# Patient Record
Sex: Female | Born: 1995 | Race: White | Hispanic: No | Marital: Single | Smoking: Never smoker
Health system: Southern US, Community
[De-identification: ages and names within clinical notes are randomized; demographics above are authoritative.]

## PROBLEM LIST (undated history)

## (undated) DIAGNOSIS — N189 Chronic kidney disease, unspecified: Secondary | ICD-10-CM

## (undated) HISTORY — PX: NO PAST SURGERIES: SHX2092

---

## 2020-07-31 ENCOUNTER — Emergency Department (HOSPITAL_COMMUNITY)
Admission: EM | Admit: 2020-07-31 | Discharge: 2020-07-31 | Disposition: A | Discharge status: Home / Self Care | Payer: 59 | Attending: Emergency Medicine | Admitting: Emergency Medicine

## 2020-07-31 ENCOUNTER — Encounter (HOSPITAL_COMMUNITY): Payer: Self-pay | Admitting: Emergency Medicine

## 2020-07-31 ENCOUNTER — Other Ambulatory Visit: Payer: Self-pay

## 2020-07-31 ENCOUNTER — Emergency Department (HOSPITAL_COMMUNITY): Payer: 59

## 2020-07-31 DIAGNOSIS — S93602A Unspecified sprain of left foot, initial encounter: Secondary | ICD-10-CM | POA: Diagnosis not present

## 2020-07-31 DIAGNOSIS — X501XXA Overexertion from prolonged static or awkward postures, initial encounter: Secondary | ICD-10-CM | POA: Diagnosis not present

## 2020-07-31 DIAGNOSIS — S99922A Unspecified injury of left foot, initial encounter: Secondary | ICD-10-CM | POA: Diagnosis present

## 2020-07-31 DIAGNOSIS — T1490XA Injury, unspecified, initial encounter: Secondary | ICD-10-CM

## 2020-07-31 NOTE — ED Provider Notes (Signed)
[facility]-EMERGENCY DEPT Provider Note   CSN: [id number] Arrival date & time: 07/31/20  4332     History Chief Complaint  Patient presents with  . Foot Pain    Mary Hutchinson is a 24 y.o. female.  24 year old female presents with left lateral foot pain.  Patient states that she stood up and did not realize her foot was asleep and rolled her foot.  Reports pain to the lateral aspect of her foot, worse with movement or bearing weight.  No other injuries, complaints, concerns.        History reviewed. No pertinent past medical history.  There are no problems to display for this patient.   History reviewed. No pertinent surgical history.   OB History   No obstetric history on file.     No family history on file.  Social History   Tobacco Use  . Smoking status: Not on file  Substance Use Topics  . Alcohol use: Not on file  . Drug use: Not on file    Home Medications Prior to Admission medications   Not on File    Allergies    Patient has no known allergies.  Review of Systems   Review of Systems  Constitutional: Negative for fever.  Musculoskeletal: Positive for arthralgias, gait problem and myalgias.  Skin: Negative for color change, rash and wound.  Allergic/Immunologic: Negative for immunocompromised state.  Neurological: Negative for weakness and numbness.    Physical Exam Updated Vital Signs BP 134/82 (BP Location: Right Arm)   Pulse (!) 106   Temp 98.4 F (36.9 C) (Oral) Comment: 98.4  Resp 16   SpO2 100%   Physical Exam Vitals and nursing note reviewed.  Constitutional:      General: She is not in acute distress.    Appearance: She is well-developed. She is not diaphoretic.  HENT:     Head: Normocephalic and atraumatic.  Cardiovascular:     Pulses: Normal pulses.  Pulmonary:     Effort: Pulmonary effort is normal.  Musculoskeletal:        General: Tenderness present. No swelling or deformity.     Left ankle:  Normal. No swelling, deformity or ecchymosis. No tenderness. Normal range of motion. Normal pulse.     Left foot: Normal capillary refill. Tenderness present. No bony tenderness or crepitus. Normal pulse.       Feet:  Skin:    General: Skin is warm and dry.     Findings: No erythema or rash.  Neurological:     Mental Status: She is alert and oriented to person, place, and time.  Psychiatric:        Behavior: Behavior normal.     ED Results / Procedures / Treatments   Labs (all labs ordered are listed, but only abnormal results are displayed) Labs Reviewed - No data to display  EKG None  Radiology DG Foot Complete Left  Result Date: 07/31/2020 CLINICAL DATA:  Left foot pain after fall. EXAM: LEFT FOOT - COMPLETE 3+ VIEW COMPARISON:  No prior. FINDINGS: No acute bony or joint abnormality. No evidence of fracture dislocation. IMPRESSION: Acute abnormality. Electronically Signed   By: Maisie Fus  Register   On: 07/31/2020 09:51    Procedures Procedures (including critical care time)  Medications Ordered in ED Medications - No data to display  ED Course  I have reviewed the triage vital signs and the nursing notes.  Pertinent labs & imaging results that were available during my care  of the patient were reviewed by me and considered in my medical decision making (see chart for details).  Clinical Course as of Jul 31 1006  Fri Jul 31, 2020  [id number] 24 year old female with left lateral foot pain after rolling her foot today.  Ankles unremarkable, does have tenderness with palpation range of motion of the lateral aspect of the left foot.  X-ray is unremarkable.  Plan is to give crutches and a postop shoe, recommend ice and elevate and follow-up with podiatry if pain persists.  Can take Motrin Tylenol.   [LM]    Clinical Course User Index [LM] Alden Hipp   MDM Rules/Calculators/A&P                          Final Clinical Impression(s) / ED Diagnoses Final diagnoses:    Injury  Sprain of left foot, initial encounter    Rx / DC Orders ED Discharge Orders    None       Alden Hipp 07/31/20 1007    Linwood Dibbles, MD 07/31/20 1610

## 2020-07-31 NOTE — ED Triage Notes (Signed)
Patient c/o left foot pain after fall last night.

## 2020-07-31 NOTE — Discharge Instructions (Signed)
Weight bear as tolerated.  Apply ice and elevate for 20 minutes at a time 3 times daily.  Take Motrin Tylenol as needed as directed for pain.  Follow-up with podiatry if pain persists.

## 2020-08-06 ENCOUNTER — Encounter: Payer: Self-pay | Admitting: Podiatry

## 2020-08-06 ENCOUNTER — Ambulatory Visit (INDEPENDENT_AMBULATORY_CARE_PROVIDER_SITE_OTHER): Payer: 59

## 2020-08-06 ENCOUNTER — Other Ambulatory Visit: Payer: Self-pay

## 2020-08-06 ENCOUNTER — Ambulatory Visit (INDEPENDENT_AMBULATORY_CARE_PROVIDER_SITE_OTHER): Payer: 59 | Admitting: Podiatry

## 2020-08-06 DIAGNOSIS — S92302A Fracture of unspecified metatarsal bone(s), left foot, initial encounter for closed fracture: Secondary | ICD-10-CM

## 2020-08-06 MED ORDER — MELOXICAM 7.5 MG PO TABS
7.5000 mg | ORAL_TABLET | Freq: Every day | ORAL | 0 refills | Status: DC
Start: 2020-08-06 — End: 2021-06-28

## 2020-08-06 MED ORDER — MELOXICAM 7.5 MG PO TABS
7.5000 mg | ORAL_TABLET | Freq: Every day | ORAL | 0 refills | Status: DC
Start: 2020-08-06 — End: 2020-08-06

## 2020-08-06 MED ORDER — MELOXICAM 7.5 MG PO TABS: 7.5000 mg | ORAL_TABLET | Freq: Every day | ORAL | 0 refills | Status: DC

## 2020-08-06 NOTE — Progress Notes (Signed)
Subjective:   Patient ID: Mary Hutchinson, female   DOB: 24 y.o.   MRN: [id number]   HPI 24 year old female presents the office today for concerns of an injury to her left foot.  She had gone to emergency room for this and he was placed in a surgical shoe which helped some.  She states on Thursday night she was sleepwalking and she slammed her foot.  Later on she started discomfort and when she woke up in the morning she had swelling and pain not able to put weight on her foot.  She points to the fifth MPJ as well as the fifth metatarsal base where she has majority of tenderness.  She is also noticed bruising to the foot.  She has no other concerns today.   Review of Systems  All other systems reviewed and are negative.  No past medical history on file.  No past surgical history on file.   Current Outpatient Medications:  .  meloxicam (MOBIC) 7.5 MG tablet, Take 1 tablet (7.5 mg total) by mouth daily., Disp: 14 tablet, Rfl: 0  No Known Allergies      Objective:  Physical Exam  General: AAO x3, NAD  Dermatological: Present evidence of the lateral aspect of foot there is no skin breakdown.  No open lesions.  There are no open sores, no preulcerative lesions, no rash or signs of infection present.  Vascular: Dorsalis Pedis artery and Posterior Tibial artery pedal pulses are 2/4 bilateral with immedate capillary fill time. There is no pain with calf compression, swelling, warmth, erythema.   Neruologic: Grossly intact via light touch bilateral.  Musculoskeletal: There is tenderness on the fifth metatarsal base as well as the fifth metatarsal head this is where the majority of edema and ecchymosis is present.  Flexor, extensor tendons are intact.  MMT 5/5.    Gait: Unassisted, Nonantalgic.       Assessment:   24 year old female with likely fifth metatarsal base hairline fracture  Plan:  -Treatment options discussed including all alternatives, risks, and complications -Etiology  of symptoms were discussed -X-rays were obtained and reviewed with the patient.  Radiolucency in the fifth metatarsal base concerning for possible fracture versus edema.  Given this the majority tenderness is localized we will treat as a fracture.  Recommend elevation in cam boot which is dispensed.  Ice elevation.  Meloxicam as needed.  .sig

## 2020-08-06 NOTE — Addendum Note (Signed)
Addended by: Ovid Curd R on: 08/06/2020 07:08 PM   Modules accepted: Orders

## 2020-08-06 NOTE — Patient Instructions (Signed)
Walking Boot, Adult  A walking boot is a medical device that holds your foot or ankle in place after an injury or a medical procedure. This helps with healing and prevents further injury. A walking boot is a removable boot-shaped splint. It has a hard, rigid outer frame that limits movement and supports your foot and leg. The inner lining is a layer of padded material. Walking boots usually have adjustable straps to secure them over the foot and leg. Your health care provider may prescribe a walking boot if you can put weight (bear weight) on your injured foot. How much you can walk while wearing the boot will depend on the type and severity of your injury. Your health care provider will recommend the best boot for you based on your condition. How do I put on my walking boot? There are different types of walking boots. Each type of boot has specific instructions about how to wear it properly. Follow instructions from your health care provider about wearing your boot. In general:  Ask someone to help you put on the boot, if needed.  Sit to put on your boot. Doing this is more comfortable and it helps to prevent falls.  Open up the boot fully. Place your foot into the boot so your heel rests against the back.  Your toes should be supported by the base of the boot. They should not hang over the front edge.  Adjust the straps so the boot fits securely but is not too tight.  Do not bend the hard frame of the boot to get a good fit. What are some tips for walking with a walking boot?  Do not try to walk without wearing the boot unless your health care provider has approved.  Use other assistive walking devices, including crutches and canes, as told by your health care provider.  On your uninjured foot, wear a shoe with a heel that is close to the height of the walking boot.  Be careful when walking on surfaces that are uneven or wet. How can I reduce swelling while using a walking  boot?   Rest your injured foot or leg as much as possible.  If directed, apply ice to the injured area: ? Put ice in a plastic bag. ? Place a towel between your skin and the bag. ? Leave the ice on for 20 minutes, 2-3 times a day.  Keep your injured foot or leg raised (elevated) above the level of your heart whenever able. Try to do this for at least 2?3 hours each day or as told by your health care provider.  If swelling gets worse, loosen the boot and rest and elevate your foot and leg. How should I take care of my skin and foot while using a walking boot?  Wear a long sock to protect your foot and leg from rubbing inside the boot.  Take off the boot one time each day to check the injured area. Look at your foot, surrounding skin, and leg to make sure there are no sores, rashes, swelling, or wounds. The skin should be a healthy color, not pale or blue.  Try to notice if your walking pattern (gait) in the boot is fairly normal and that you are not walking with a noticeable limp.  Follow instructions from your health care provider about taking care of your incision or wound, if this applies.  Clean and wash the injured area as told by your health care provider.  Gently dry   your foot and leg before putting the boot back on. Are there any activity restrictions? Activity restrictions depend on the type and severity of your injury. Follow instructions from your health care provider about limiting activities.  Bathe and shower as told by your health care provider.  Do not do any activities that could make your injury worse.  Do not drive if your affected foot is one that you usually use for driving. When can I remove my walking boot? Always follow specific directions from your health care provider for removing the walking boot. Generally, it is okay to remove your walking boot:  At the end of the day when you are resting or sleeping.  To clean your foot and leg. How should I keep  my walking boot clean?  Do not put any part of the boot in a washing machine or dryer.  Do not use chemical cleaning products. These could irritate your skin, especially if you have a wound or an incision.  Do not soak the liner of the boot.  Use a washcloth with mild soap and water to clean the frame and the liner of the boot by hand.  Allow the boot to air-dry completely before you put it back on your foot. Contact a health care provider if:  The boot is cracked or damaged.  The boot does not fit properly.  Your foot or leg hurts.  You have a rash, sore, or open sore (ulcer) on your foot or leg.  The skin on your foot or leg is pale.  You have a wound or incision on the foot and it is getting worse.  Your skin becomes painful, red, or irritated.  Your swelling does not get better or it gets worse. Get help right away if:  You cannot feel your foot or leg (have numbness).  You cannot feel a pulse at the top of your foot, where your foot and ankle meet.  Your skin on the foot or leg is cold, blue, or gray. Summary  A walking boot holds your foot or ankle in place after an injury or a medical procedure.  There are different types of walking boots. Follow the specific instructions about how to correctly wear the boot that you have.  Ask someone to help you put on the boot, if needed.  It is important to check your skin and foot every day. Call your health care provider if you notice a rash or sore on your foot or leg. This information is not intended to replace advice given to you by your health care provider. Make sure you discuss any questions you have with your health care provider. Document Revised: 02/12/2019 Document Reviewed: 12/01/2016 Elsevier Patient Education  2020 [employer]  

## 2020-08-31 ENCOUNTER — Ambulatory Visit (INDEPENDENT_AMBULATORY_CARE_PROVIDER_SITE_OTHER): Payer: 59

## 2020-08-31 ENCOUNTER — Ambulatory Visit (INDEPENDENT_AMBULATORY_CARE_PROVIDER_SITE_OTHER): Payer: 59 | Admitting: Podiatry

## 2020-08-31 ENCOUNTER — Encounter: Payer: Self-pay | Admitting: Podiatry

## 2020-08-31 ENCOUNTER — Other Ambulatory Visit: Payer: Self-pay

## 2020-08-31 DIAGNOSIS — S92302A Fracture of unspecified metatarsal bone(s), left foot, initial encounter for closed fracture: Secondary | ICD-10-CM | POA: Diagnosis not present

## 2020-08-31 DIAGNOSIS — S99922D Unspecified injury of left foot, subsequent encounter: Secondary | ICD-10-CM

## 2020-09-06 NOTE — Progress Notes (Signed)
Subjective: 24 year old female presents the office for follow-up evaluation of left foot injury.  She states that she is feeling better.  The boot did help with the discomfort and overall she feels that she is making progress.  No recent injury or falls or changes since I last saw her. Denies any systemic complaints such as fevers, chills, nausea, vomiting. No acute changes since last appointment, and no other complaints at this time.   Objective: AAO x3, NAD DP/PT pulses palpable bilaterally, CRT less than 3 seconds On today's evaluation there is no significant discomfort to palpation particularly on the fifth metatarsal base.  Flexor, extensor tendons appear intact.  95/5.  No apparent discomfort. No pain with calf compression, swelling, warmth, erythema  Assessment: Resolving left foot pain  Plan: -All treatment options discussed with the patient including all alternatives, risks, complications.  -Repeat x-rays obtained reviewed.  Radiolucency of the base of the fifth metatarsal which improved.  No evidence of acute fracture otherwise. -She seems to be improving.  She got her regular shoe.  Continue ice to the area gradual increase activity level.  Continue reoccurrence return to the cam boot.  Anti-inflammatories as needed. -Patient encouraged to call the office with any questions, concerns, change in symptoms.   Vivi Barrack DPM

## 2021-06-28 ENCOUNTER — Emergency Department (HOSPITAL_COMMUNITY)
Admission: EM | Admit: 2021-06-28 | Discharge: 2021-06-28 | Disposition: A | Discharge status: Home / Self Care | Payer: 59 | Attending: Emergency Medicine | Admitting: Emergency Medicine

## 2021-06-28 ENCOUNTER — Encounter (HOSPITAL_COMMUNITY): Payer: Self-pay

## 2021-06-28 ENCOUNTER — Other Ambulatory Visit: Payer: Self-pay

## 2021-06-28 DIAGNOSIS — B9620 Unspecified Escherichia coli [E. coli] as the cause of diseases classified elsewhere: Secondary | ICD-10-CM | POA: Diagnosis not present

## 2021-06-28 DIAGNOSIS — N390 Urinary tract infection, site not specified: Secondary | ICD-10-CM | POA: Insufficient documentation

## 2021-06-28 DIAGNOSIS — R109 Unspecified abdominal pain: Secondary | ICD-10-CM | POA: Diagnosis present

## 2021-06-28 LAB — URINALYSIS, ROUTINE W REFLEX MICROSCOPIC
Bilirubin Urine: NEGATIVE
Glucose, UA: NEGATIVE mg/dL
Ketones, ur: NEGATIVE mg/dL
Nitrite: NEGATIVE
Protein, ur: NEGATIVE mg/dL
Specific Gravity, Urine: 1.011 (ref 1.005–1.030)
pH: 6 (ref 5.0–8.0)

## 2021-06-28 LAB — CBC WITH DIFFERENTIAL/PLATELET
Abs Immature Granulocytes: 0.02 10*3/uL (ref 0.00–0.07)
Basophils Absolute: 0 10*3/uL (ref 0.0–0.1)
Basophils Relative: 0 %
Eosinophils Absolute: 0.1 10*3/uL (ref 0.0–0.5)
Eosinophils Relative: 1 %
HCT: 40.8 % (ref 36.0–46.0)
Hemoglobin: 13.3 g/dL (ref 12.0–15.0)
Immature Granulocytes: 0 %
Lymphocytes Relative: 15 %
Lymphs Abs: 1.7 10*3/uL (ref 0.7–4.0)
MCH: 29 pg (ref 26.0–34.0)
MCHC: 32.6 g/dL (ref 30.0–36.0)
MCV: 89.1 fL (ref 80.0–100.0)
Monocytes Absolute: 0.8 10*3/uL (ref 0.1–1.0)
Monocytes Relative: 7 %
Neutro Abs: 8.5 10*3/uL — ABNORMAL HIGH (ref 1.7–7.7)
Neutrophils Relative %: 77 %
Platelets: 330 10*3/uL (ref 150–400)
RBC: 4.58 MIL/uL (ref 3.87–5.11)
RDW: 13.1 % (ref 11.5–15.5)
WBC: 11.1 10*3/uL — ABNORMAL HIGH (ref 4.0–10.5)
nRBC: 0 % (ref 0.0–0.2)

## 2021-06-28 LAB — COMPREHENSIVE METABOLIC PANEL
ALT: 13 U/L (ref 0–44)
AST: 16 U/L (ref 15–41)
Albumin: 4.3 g/dL (ref 3.5–5.0)
Alkaline Phosphatase: 57 U/L (ref 38–126)
Anion gap: 8 (ref 5–15)
BUN: 14 mg/dL (ref 6–20)
CO2: 25 mmol/L (ref 22–32)
Calcium: 9.2 mg/dL (ref 8.9–10.3)
Chloride: 109 mmol/L (ref 98–111)
Creatinine, Ser: 0.84 mg/dL (ref 0.44–1.00)
GFR, Estimated: >60 mL/min (ref >60)
Glucose, Bld: 96 mg/dL (ref 70–99)
Potassium: 3.6 mmol/L (ref 3.5–5.1)
Sodium: 142 mmol/L (ref 135–145)
Total Bilirubin: 0.5 mg/dL (ref 0.3–1.2)
Total Protein: 7.8 g/dL (ref 6.5–8.1)

## 2021-06-28 LAB — LIPASE, BLOOD: Lipase: 35 U/L (ref 11–51)

## 2021-06-28 LAB — HCG, QUANTITATIVE, PREGNANCY: hCG, Beta Chain, Quant, S: <1 m[IU]/mL (ref <5)

## 2021-06-28 MED ORDER — SODIUM CHLORIDE 0.9 % IV SOLN
1.0000 g | Freq: Once | INTRAVENOUS | Status: AC
  Administered 2021-06-28: 1 g via INTRAVENOUS
  Filled 2021-06-28: qty 10

## 2021-06-28 MED ORDER — CEPHALEXIN 500 MG PO CAPS: 500.0000 mg | ORAL_CAPSULE | Freq: Two times a day (BID) | ORAL | 0 refills | Status: AC

## 2021-06-28 MED ORDER — ONDANSETRON HCL 4 MG/2ML IJ SOLN
4.0000 mg | Freq: Once | INTRAMUSCULAR | Status: AC
  Administered 2021-06-28: 4 mg via INTRAVENOUS
  Filled 2021-06-28: qty 2

## 2021-06-28 MED ORDER — SODIUM CHLORIDE 0.9 % IV BOLUS
1000.0000 mL | Freq: Once | INTRAVENOUS | Status: AC
  Administered 2021-06-28: 1000 mL via INTRAVENOUS

## 2021-06-28 MED ORDER — FAMOTIDINE IN NACL 20-0.9 MG/50ML-% IV SOLN
20.0000 mg | Freq: Once | INTRAVENOUS | Status: AC
  Administered 2021-06-28: 20 mg via INTRAVENOUS
  Filled 2021-06-28: qty 50

## 2021-06-28 NOTE — ED Provider Notes (Signed)
[facility]-EMERGENCY DEPT Provider Note   CSN: [id number] Arrival date & time: 06/28/21  0809     History Chief Complaint  Patient presents with   Flank Pain    Mary Hutchinson is a 25 y.o. female.  This is a 25 year old female history as below presented to ER secondary to left-sided flank pain.  Pain onset yesterday morning.  Described as a sharp, aching sensation to her left flank.  Radiation to her left groin.  Pain is been constant.  No vomiting does report that the pain makes her feel nauseated when it is bad.  Pain worsened with direct palpation.  Pain improved with rest, not moving.  No urinary complaints.  No abnormal vaginal bleeding or discharge.  No concern for STI.  No falls.  No chest pain or dyspnea, no URI symptoms.  No history of kidney stones.  Denies similar symptoms in the past.  The history is provided by the patient. No language interpreter was used.  Flank Pain Pertinent negatives include no chest pain, no abdominal pain, no headaches and no shortness of breath.      History reviewed. No pertinent past medical history.  There are no problems to display for this patient.   History reviewed. No pertinent surgical history.   OB History   No obstetric history on file.     History reviewed. No pertinent family history.  Social History   Tobacco Use   Smoking status: Never   Smokeless tobacco: Never  Substance Use Topics   Alcohol use: Yes    Comment: occasionally    Home Medications Prior to Admission medications   Medication Sig Start Date End Date Taking? Authorizing Provider  cephALEXin (KEFLEX) 500 MG capsule Take 1 capsule (500 mg total) by mouth 2 (two) times daily for 10 doses. 06/28/21 07/03/21 Yes Sloan Leiter, DO    Allergies    Patient has no known allergies.  Review of Systems   Review of Systems  Constitutional:  Negative for chills and fever.  HENT:  Negative for facial swelling and trouble swallowing.    Eyes:  Negative for photophobia and visual disturbance.  Respiratory:  Negative for cough and shortness of breath.   Cardiovascular:  Negative for chest pain and palpitations.  Gastrointestinal:  Positive for nausea. Negative for abdominal pain and vomiting.  Endocrine: Negative for polydipsia and polyuria.  Genitourinary:  Positive for flank pain. Negative for difficulty urinating and hematuria.  Musculoskeletal:  Negative for gait problem and joint swelling.  Skin:  Negative for pallor and rash.  Neurological:  Negative for syncope and headaches.  Psychiatric/Behavioral:  Negative for agitation and confusion.    Physical Exam Updated Vital Signs BP (!) 110/91   Pulse 71   Temp 98.9 F (37.2 C) (Oral)   Resp 16   Wt 68 kg   LMP 06/28/2021 (Exact Date)   SpO2 100%   Physical Exam Vitals and nursing note reviewed.  Constitutional:      General: She is not in acute distress.    Appearance: Normal appearance.  HENT:     Head: Normocephalic and atraumatic.     Right Ear: External ear normal.     Left Ear: External ear normal.     Nose: Nose normal.     Mouth/Throat:     Mouth: Mucous membranes are moist.  Eyes:     General: No scleral icterus.       Right eye: No discharge.  Left eye: No discharge.  Cardiovascular:     Rate and Rhythm: Normal rate and regular rhythm.     Pulses: Normal pulses.     Heart sounds: Normal heart sounds.  Pulmonary:     Effort: Pulmonary effort is normal. No respiratory distress.     Breath sounds: Normal breath sounds.  Abdominal:     General: Abdomen is flat.     Tenderness: There is abdominal tenderness in the left upper quadrant and left lower quadrant.       Comments: Non peritoneal. Abdomen is soft. Not rigid.   Musculoskeletal:        General: Normal range of motion.     Cervical back: Normal range of motion.     Right lower leg: No edema.     Left lower leg: No edema.  Skin:    General: Skin is warm and dry.      Capillary Refill: Capillary refill takes less than 2 seconds.  Neurological:     Mental Status: She is alert.  Psychiatric:        Mood and Affect: Mood normal.        Behavior: Behavior normal.    ED Results / Procedures / Treatments   Labs (all labs ordered are listed, but only abnormal results are displayed) Labs Reviewed  CBC WITH DIFFERENTIAL/PLATELET - Abnormal; Notable for the following components:      Result Value   WBC 11.1 (*)    Neutro Abs 8.5 (*)    All other components within normal limits  URINALYSIS, ROUTINE W REFLEX MICROSCOPIC - Abnormal; Notable for the following components:   APPearance HAZY (*)    Hgb urine dipstick MODERATE (*)    Leukocytes,Ua SMALL (*)    Bacteria, UA RARE (*)    All other components within normal limits  URINE CULTURE  COMPREHENSIVE METABOLIC PANEL  LIPASE, BLOOD  HCG, QUANTITATIVE, PREGNANCY    EKG None  Radiology No results found.  Procedures Procedures   Medications Ordered in ED Medications  sodium chloride 0.9 % bolus 1,000 mL (1,000 mLs Intravenous New Bag/Given 06/28/21 0912)  ondansetron (ZOFRAN) injection 4 mg (4 mg Intravenous Given 06/28/21 0914)  famotidine (PEPCID) IVPB 20 mg premix (0 mg Intravenous Stopped 06/28/21 1015)  cefTRIAXone (ROCEPHIN) 1 g in sodium chloride 0.9 % 100 mL IVPB (0 g Intravenous Stopped 06/28/21 1112)    ED Course  I have reviewed the triage vital signs and the nursing notes.  Pertinent labs & imaging results that were available during my care of the patient were reviewed by me and considered in my medical decision making (see chart for details).    MDM Rules/Calculators/A&P                          This patient complains of abdominal pain; this involves an extensive number of treatment  Options and is a complaint that carries with it a high risk of complications and Morbidity. Vital signs reviewed and stable. Serious etiologies considered.   I ordered, reviewed and interpreted  labs, which included UA concerning for UTI, urine culture sent. Started on antibiotics. No vomiting, mild leukocytosis on CBC (11.1) and renal function is WNL, clinically doubt pyelonephritis or septic stone. Urine culture sent.   She was started on abx. Her exam is improved, she does not want any analgesia. She has no nausea or emesis and is able to tolerate PO. Reasonable to trial outpatient oral ABX at  this time at patient's request.     The patient's overall condition has improved, the patient presents with abdominal pain without signs of peritonitis, or other life-threatening serious etiology. Favor UTI, possibly pyelonephritis although clinically doubt this process.  Detailed discussions were had with the patient regarding current findings, and need for close f/u with PCP or on call doctor. The patient appears stable for discharge and has been instructed to return immediately if the symptoms worsen in any way.    Patient verbalized understanding and is in agreement with current care plan.  All questions answered prior to discharge.   Final Clinical Impression(s) / ED Diagnoses Final diagnoses:  Urinary tract infection without hematuria, site unspecified    Rx / DC Orders ED Discharge Orders          Ordered    cephALEXin (KEFLEX) 500 MG capsule  2 times daily        06/28/21 1127             Sloan Leiter, DO 06/28/21 1128

## 2021-06-28 NOTE — ED Triage Notes (Signed)
Pt presents with c/o left side flank pain since yesterday. Pt denies any hematuria or dysuria. Pt also denies any previous hx of kidney stones. Pt denies any trauma or injury to that area.

## 2021-06-28 NOTE — ED Notes (Signed)
Pt will provide another urine sample for a culture.

## 2021-06-28 NOTE — ED Notes (Signed)
Lab is going to to add urine culture to urine specimen already in lab.

## 2021-06-30 LAB — URINE CULTURE: Culture: >=100000 — AB

## 2021-09-09 LAB — OB RESULTS CONSOLE ABO/RH: RH Type: POSITIVE

## 2021-09-09 LAB — OB RESULTS CONSOLE HIV ANTIBODY (ROUTINE TESTING): HIV: NONREACTIVE

## 2021-09-09 LAB — HEPATITIS C ANTIBODY: HCV Ab: NEGATIVE

## 2021-09-09 LAB — OB RESULTS CONSOLE HEPATITIS B SURFACE ANTIGEN: Hepatitis B Surface Ag: NEGATIVE

## 2021-09-09 LAB — OB RESULTS CONSOLE RUBELLA ANTIBODY, IGM: Rubella: IMMUNE

## 2021-09-09 LAB — OB RESULTS CONSOLE GC/CHLAMYDIA
Chlamydia: NEGATIVE
Gonorrhea: NEGATIVE

## 2021-09-09 LAB — OB RESULTS CONSOLE VARICELLA ZOSTER ANTIBODY, IGG: Varicella: NON-IMMUNE/NOT IMMUNE

## 2021-09-09 LAB — OB RESULTS CONSOLE RPR: RPR: NONREACTIVE

## 2021-11-07 NOTE — L&D Delivery Note (Addendum)
Delivery Note ?Mary Hutchinson is a G1P0000 at [redacted]w[redacted]d who had a spontaneous delivery at 60 a viable female ("Amora") was delivered via  ROA.  APGAR: 9, 9 ; weight 5lb 0.4oz (2280g) .    ? ?Admitted for preterm labor. AROM at 7.5cm.  Progressed normally. Received epidural for pain management. Pushed for 10 minutes with deep variable decelerations. Baby was delivered without difficulty. No nuchal cord, however cord very short and wrapped around leg.  Delayed cord clamping for 60 seconds.  Delivery of placenta was spontaneous. Placenta was found to be intact, 3 -vessel cord was noted. The fundus was found to be firm. 1st degree perineal laceration was repaired in the normal sterile fashion with 3-0 vicryl. Estimated blood loss 200cc.  Instrument and gauze counts were correct at the end of the procedure. ? ? ?Placenta status: to pathology ? ?Anesthesia:  epidural ?Episiotomy:  none ?Lacerations:  1st degree perineal ?Suture Repair: 3.0 vicryl ?Est. Blood Loss (mL):   ? ?Mom to postpartum.  Baby to Couplet care / Skin to Skin. ? ?Charlett Nose ?03/06/2022, 2:13 AM ? ?

## 2022-01-07 ENCOUNTER — Emergency Department (HOSPITAL_COMMUNITY): Payer: 59

## 2022-01-07 ENCOUNTER — Observation Stay (HOSPITAL_COMMUNITY)
Admission: EM | Admit: 2022-01-07 | Discharge: 2022-01-08 | Disposition: A | Discharge status: Home / Self Care | Payer: 59 | Attending: Obstetrics and Gynecology | Admitting: Obstetrics and Gynecology

## 2022-01-07 ENCOUNTER — Encounter (HOSPITAL_COMMUNITY): Payer: Self-pay

## 2022-01-07 DIAGNOSIS — O2302 Infections of kidney in pregnancy, second trimester: Secondary | ICD-10-CM | POA: Insufficient documentation

## 2022-01-07 DIAGNOSIS — Z3A27 27 weeks gestation of pregnancy: Secondary | ICD-10-CM | POA: Insufficient documentation

## 2022-01-07 DIAGNOSIS — Z20822 Contact with and (suspected) exposure to covid-19: Secondary | ICD-10-CM | POA: Diagnosis present

## 2022-01-07 DIAGNOSIS — O26899 Other specified pregnancy related conditions, unspecified trimester: Secondary | ICD-10-CM

## 2022-01-07 DIAGNOSIS — O2303 Infections of kidney in pregnancy, third trimester: Secondary | ICD-10-CM

## 2022-01-07 DIAGNOSIS — R109 Unspecified abdominal pain: Secondary | ICD-10-CM | POA: Insufficient documentation

## 2022-01-07 DIAGNOSIS — O26892 Other specified pregnancy related conditions, second trimester: Secondary | ICD-10-CM | POA: Diagnosis present

## 2022-01-07 LAB — COMPREHENSIVE METABOLIC PANEL
ALT: 30 U/L (ref 0–44)
AST: 26 U/L (ref 15–41)
Albumin: 3 g/dL — ABNORMAL LOW (ref 3.5–5.0)
Alkaline Phosphatase: 64 U/L (ref 38–126)
Anion gap: 7 (ref 5–15)
BUN: 11 mg/dL (ref 6–20)
CO2: 21 mmol/L — ABNORMAL LOW (ref 22–32)
Calcium: 8 mg/dL — ABNORMAL LOW (ref 8.9–10.3)
Chloride: 105 mmol/L (ref 98–111)
Creatinine, Ser: 0.49 mg/dL (ref 0.44–1.00)
GFR, Estimated: >60 mL/min (ref >60)
Glucose, Bld: 85 mg/dL (ref 70–99)
Potassium: 3.5 mmol/L (ref 3.5–5.1)
Sodium: 133 mmol/L — ABNORMAL LOW (ref 135–145)
Total Bilirubin: 0.3 mg/dL (ref 0.3–1.2)
Total Protein: 6.3 g/dL — ABNORMAL LOW (ref 6.5–8.1)

## 2022-01-07 LAB — CBC WITH DIFFERENTIAL/PLATELET
Abs Immature Granulocytes: 0.1 10*3/uL — ABNORMAL HIGH (ref 0.00–0.07)
Basophils Absolute: 0 10*3/uL (ref 0.0–0.1)
Basophils Relative: 0 %
Eosinophils Absolute: 0.1 10*3/uL (ref 0.0–0.5)
Eosinophils Relative: 1 %
HCT: 28.2 % — ABNORMAL LOW (ref 36.0–46.0)
Hemoglobin: 9.4 g/dL — ABNORMAL LOW (ref 12.0–15.0)
Immature Granulocytes: 1 %
Lymphocytes Relative: 12 %
Lymphs Abs: 1.4 10*3/uL (ref 0.7–4.0)
MCH: 29.7 pg (ref 26.0–34.0)
MCHC: 33.3 g/dL (ref 30.0–36.0)
MCV: 89 fL (ref 80.0–100.0)
Monocytes Absolute: 0.7 10*3/uL (ref 0.1–1.0)
Monocytes Relative: 5 %
Neutro Abs: 10 10*3/uL — ABNORMAL HIGH (ref 1.7–7.7)
Neutrophils Relative %: 81 %
Platelets: 252 10*3/uL (ref 150–400)
RBC: 3.17 MIL/uL — ABNORMAL LOW (ref 3.87–5.11)
RDW: 13.3 % (ref 11.5–15.5)
WBC: 12.3 10*3/uL — ABNORMAL HIGH (ref 4.0–10.5)
nRBC: 0 % (ref 0.0–0.2)

## 2022-01-07 LAB — URINALYSIS, ROUTINE W REFLEX MICROSCOPIC
Bilirubin Urine: NEGATIVE
Glucose, UA: NEGATIVE mg/dL
Hgb urine dipstick: NEGATIVE
Ketones, ur: 5 mg/dL — AB
Nitrite: NEGATIVE
Protein, ur: 30 mg/dL — AB
Specific Gravity, Urine: 1.016 (ref 1.005–1.030)
WBC, UA: >50 WBC/hpf — ABNORMAL HIGH (ref 0–5)
pH: 6 (ref 5.0–8.0)

## 2022-01-07 LAB — RESP PANEL BY RT-PCR (FLU A&B, COVID) ARPGX2
Influenza A by PCR: NEGATIVE
Influenza B by PCR: NEGATIVE
SARS Coronavirus 2 by RT PCR: NEGATIVE

## 2022-01-07 MED ORDER — SODIUM CHLORIDE 0.9 % IV SOLN
2.0000 g | Freq: Once | INTRAVENOUS | Status: DC
  Filled 2022-01-07: qty 20

## 2022-01-07 MED ORDER — ZOLPIDEM TARTRATE 5 MG PO TABS: 5.0000 mg | ORAL_TABLET | Freq: Every evening | ORAL | Status: DC | PRN

## 2022-01-07 MED ORDER — ONDANSETRON HCL 4 MG/2ML IJ SOLN
4.0000 mg | Freq: Once | INTRAMUSCULAR | Status: AC
  Administered 2022-01-07: 4 mg via INTRAVENOUS
  Filled 2022-01-07: qty 2

## 2022-01-07 MED ORDER — DOCUSATE SODIUM 100 MG PO CAPS
100.0000 mg | ORAL_CAPSULE | Freq: Every day | ORAL | Status: DC
  Filled 2022-01-07: qty 1

## 2022-01-07 MED ORDER — PRENATAL MULTIVITAMIN CH
1.0000 | ORAL_TABLET | Freq: Every day | ORAL | Status: DC
  Filled 2022-01-07: qty 1

## 2022-01-07 MED ORDER — SODIUM CHLORIDE 0.9 % IV SOLN
2.0000 g | INTRAVENOUS | Status: DC
  Administered 2022-01-07: 2 g via INTRAVENOUS
  Filled 2022-01-07: qty 20

## 2022-01-07 MED ORDER — SODIUM CHLORIDE 0.9% FLUSH
3.0000 mL | Freq: Two times a day (BID) | INTRAVENOUS | Status: DC
  Administered 2022-01-07: 3 mL via INTRAVENOUS

## 2022-01-07 MED ORDER — SODIUM CHLORIDE 0.9 % IV SOLN
250.0000 mL | INTRAVENOUS | Status: DC | PRN
Start: 2022-01-07 — End: 2022-01-08

## 2022-01-07 MED ORDER — CALCIUM CARBONATE ANTACID 500 MG PO CHEW: 2.0000 | CHEWABLE_TABLET | ORAL | Status: DC | PRN

## 2022-01-07 MED ORDER — LIDOCAINE 5 % EX PTCH
1.0000 | MEDICATED_PATCH | CUTANEOUS | Status: DC
  Administered 2022-01-07: 1 via TRANSDERMAL
  Filled 2022-01-07: qty 1

## 2022-01-07 MED ORDER — SODIUM CHLORIDE 0.9% FLUSH: 3.0000 mL | INTRAVENOUS | Status: DC | PRN

## 2022-01-07 MED ORDER — ACETAMINOPHEN 325 MG PO TABS
650.0000 mg | ORAL_TABLET | ORAL | Status: DC | PRN
  Administered 2022-01-07 (×2): 650 mg via ORAL
  Filled 2022-01-07 (×2): qty 2

## 2022-01-07 NOTE — Plan of Care (Signed)

## 2022-01-07 NOTE — ED Notes (Signed)
Unable to obtain blood cultures prior to antibiotic administration. ?

## 2022-01-07 NOTE — ED Triage Notes (Signed)
Pt presents with c/o lower back pain. Pt reports no injury, is [redacted] weeks pregnant.  ?

## 2022-01-07 NOTE — ED Provider Notes (Signed)
?[facility]-EMERGENCY DEPT ?Provider Note ? ? ?CSN: [id number] ?Arrival date & time: 01/07/22  0865 ? ?  ? ?History ? ?Chief Complaint  ?Patient presents with  ? Back Pain  ? ? ?Mary Hutchinson is a 26 y.o. female. ? ?26 year old female presents at [redacted] weeks gestation with complaint of sudden onset right-sided back pain at 6 AM today.  Pain is constant, worse with movement and palpation, associated with nausea.  Denies associated vomiting, changes in bowel or bladder habits, vaginal bleeding or discharge.  Patient has had routine prenatal care, no complications to date.  Plan to deliver at [facility]. States she is nauseous. No falls or injuries, no history of kidney stones.  ? ? ?  ? ?Home Medications ?Prior to Admission medications   ?Not on File  ?   ? ?Allergies    ?Patient has no known allergies.   ? ?Review of Systems   ?Review of Systems ?Negative except as per HPI ?Physical Exam ?Updated Vital Signs ?BP 128/73 (BP Location: Right Arm)   Pulse 88   Temp 98.3 ?F (36.8 ?C) (Oral)   Resp 15   LMP 06/28/2021 (Exact Date)   SpO2 100%  ?Physical Exam ?Vitals and nursing note reviewed.  ?Constitutional:   ?   General: She is not in acute distress. ?   Appearance: She is well-developed. She is not diaphoretic.  ?HENT:  ?   Head: Normocephalic and atraumatic.  ?Cardiovascular:  ?   Rate and Rhythm: Normal rate and regular rhythm.  ?   Heart sounds: Normal heart sounds.  ?Pulmonary:  ?   Effort: Pulmonary effort is normal.  ?   Breath sounds: Normal breath sounds.  ?Abdominal:  ?   Palpations: Abdomen is soft.  ?   Comments: gravid  ?Musculoskeletal:     ?   General: Tenderness present.  ?     Back: ? ?   Right lower leg: No edema.  ?   Left lower leg: No edema.  ?Skin: ?   General: Skin is warm and dry.  ?   Findings: No erythema or rash.  ?Neurological:  ?   Mental Status: She is alert and oriented to person, place, and time.  ?Psychiatric:     ?   Behavior: Behavior normal.  ? ? ?ED Results /  Procedures / Treatments   ?Labs ?(all labs ordered are listed, but only abnormal results are displayed) ?Labs Reviewed  ?COMPREHENSIVE METABOLIC PANEL - Abnormal; Notable for the following components:  ?    Result Value  ? Sodium 133 (*)   ? CO2 21 (*)   ? Calcium 8.0 (*)   ? Total Protein 6.3 (*)   ? Albumin 3.0 (*)   ? All other components within normal limits  ?CBC WITH DIFFERENTIAL/PLATELET - Abnormal; Notable for the following components:  ? WBC 12.3 (*)   ? RBC 3.17 (*)   ? Hemoglobin 9.4 (*)   ? HCT 28.2 (*)   ? Neutro Abs 10.0 (*)   ? Abs Immature Granulocytes 0.10 (*)   ? All other components within normal limits  ?URINALYSIS, ROUTINE W REFLEX MICROSCOPIC - Abnormal; Notable for the following components:  ? APPearance CLOUDY (*)   ? Ketones, ur 5 (*)   ? Protein, ur 30 (*)   ? Leukocytes,Ua LARGE (*)   ? WBC, UA >50 (*)   ? Bacteria, UA RARE (*)   ? All other components within normal limits  ?URINE CULTURE  ?CULTURE,  BLOOD (ROUTINE X 2)  ?CULTURE, BLOOD (ROUTINE X 2)  ?RESP PANEL BY RT-PCR (FLU A&B, COVID) ARPGX2  ? ? ?EKG ?None ? ?Radiology ?US RENAL ? ?Result Date: 01/07/2022 ?CLINICAL DATA:  Flank pain.  Twenty-seven weeks pregnant. EXAM: RENAL / URINARY TRACT ULTRASOUND COMPLETE COMPARISON:  None. FINDINGS: Right Kidney: Renal measurements: 9.7 x 5.8 x 5.5 cm = volume: 161 mL. Normal echotexture. Mildly dilated collecting system. Prominent internal vascularity with color Doppler compared to the left kidney. No mass or calculi visualized. Left Kidney: Renal measurements: 11.2 x 5.8 x 4.8 cm = volume: 162 mL. Normal echotexture. 2.0 cm parapelvic cyst. No hydronephrosis. Bladder: Appears normal for degree of bladder distention. Other: None. IMPRESSION: 1. Mild right hydronephrosis. This could be due to ureteral compression by the gravid uterus or a nonvisualized ureteral calculus. 2. Prominent internal vascularity in the right kidney compared to the left kidney. This could be due to the mild right  hydronephrosis. Pyelonephritis can also produce this appearance. 3. Unremarkable left kidney and urinary bladder. Electronically Signed   By: Beckie Salts M.D.   On: 01/07/2022 11:19   ? ?Procedures ?Procedures  ? ? ?Medications Ordered in ED ?Medications  ?lidocaine (LIDODERM) 5 % 1 patch (1 patch Transdermal Patch Applied 01/07/22 0915)  ?cefTRIAXone (ROCEPHIN) 2 g in sodium chloride 0.9 % 100 mL IVPB (has no administration in time range)  ?ondansetron (ZOFRAN) injection 4 mg (4 mg Intravenous Given 01/07/22 1036)  ? ? ?ED Course/ Medical Decision Making/ A&P ?  ?                        ?Medical Decision Making ?Amount and/or Complexity of Data Reviewed ?Labs: ordered. ?Radiology: ordered. ? ?Risk ?Prescription drug management. ?Decision regarding hospitalization. ? ? ?This patient presents to the ED for concern of right flank pain with nausea in pregnancy, this involves an extensive number of treatment options, and is a complaint that carries with it a high risk of complications and morbidity.  The differential diagnosis includes but not limited to musculoskeletal pain, pyelonephritis, kidney stone, labor, complications of pregnancy including HELLP, cholelithiasis.  ? ? ?Co morbidities that complicate the patient evaluation ? ?Pregnant  ? ? ?Additional history obtained: ? ?External records from outside source obtained and reviewed including no recent relevant records ? ? ?Lab Tests: ? ?I Ordered, and personally interpreted labs.  The pertinent results include: CBC with mild leukocytosis at 12.3, increased neutrophils.  Hemoglobin 9.4.  CMP without significant findings.  Urinalysis with cloudy urine, positive for protein, ketones, leukocytes with rare bacteria.  Sent for culture ? ? ?Imaging Studies ordered: ? ?I ordered imaging studies including renal ultrasound ?I agree with the radiologist interpretation, mild right hydronephrosis, possibly due to ureteral compression by gravid uterus versus nonvisualized ureteral  calculus, prominent internal vascularity on right possibly due to hydronephrosis versus pyelonephritis ? ?Medicines ordered and prescription drug management: ? ?I ordered medication including Lidoderm patch, Zofran for pain and vomiting ?Reevaluation of the patient after these medicines showed that the patient improved ?I have reviewed the patients home medicines and have made adjustments as needed ? ? ?Test Considered: ? ?CT abdomen/pelvis, deferred to [place] as patient is pregnant  ? ? ?Critical Interventions: ? ?OB mointoring by rapid response team who has cleared patient for further work up.  ? ? ?Consultations Obtained: ? ?I requested consultation with Dr. Henderson Cloud, [facility] with [facility],  and discussed lab and imaging findings as well as pertinent plan -  they recommend: admit to [facility] specialty care, agrees with abx. [facility] on arrival. ? ? ?Problem List / ED Course: ? ?26 yo female presents at [redacted] weeks gestation with complaint of right flank pain, sudden onset this Hutchinson as above. On exam, found to have right low back tenderness, no rash, abdomen soft and non tender. Denies vaginal bleeding/dc/leaking fluids. Patient was evaluated by rapid OB response team and cleared for further evaluation.  On recheck, patient with vomiting, will give Zofran and fluids.  CBC with mild leukocytosis, urinalysis contaminant although positive for leukocytes with bacteria, will send for culture.  Renal ultrasound concerning for dilated collecting system on right, plan is to treat for pyelonephritis.  Discussed with Dr. Freida Busman, ER attending.  Case discussed with OB who requests transfer to [facility] for admission to [facility] specialty care.  Patient updated on plan of care, agreeable. ? ? ? ? ? ? ? ? ?Final Clinical Impression(s) / ED Diagnoses ?Final diagnoses:  ?Flank pain in pregnant patient  ?Pyelonephritis affecting pregnancy in third trimester  ? ? ?Rx / DC Orders ?ED Discharge Orders   ? ? None  ? ?  ? ? ?  ?Jeannie Fend,  PA-C ?01/07/22 1202 ? ?  ?Lorre Nick, MD ?01/08/22 1228 ? ?

## 2022-01-07 NOTE — ED Notes (Signed)
Report called to the receiving RN. [facility] called for transportation.  ?

## 2022-01-07 NOTE — Progress Notes (Signed)
Pt fairly comfortable when I saw her when she arrived at [facility]. ? ?Vitals:  ? 01/07/22 1230 01/07/22 1315 01/07/22 1448 01/07/22 1503  ?BP: (!) 101/57 119/67  (!) 106/59  ?Pulse: 81 90 76 88  ?Resp: 14 16 16    ?Temp:  98.6 ?F (37 ?C) 98.4 ?F (36.9 ?C) 97.9 ?F (36.6 ?C)  ?TempSrc:  Oral Oral Oral  ?SpO2: 100% 99% 97%   ?Weight:   70.3 kg   ?Height:   5\' 4"  (1.626 m)   ?  ?FHTS 130s, gSTV, NST R ?Toco rare ?Lungs CTA ? ?A/P Observation and antibiotics for at least 24 hours. ?

## 2022-01-07 NOTE — H&P (Signed)
Please refer to full note from ED PA below.    Briefly this is a 26 y.o. G1P0 [redacted]w[redacted]d with flank pain and dysuria who presented to ER for evaluation.    ER evaluation revealed RCVA tenderness, slightly elevated WBC, renal US findings c/w pyelo or hydro,  and UTI.  Pt diganosed with pyelo despite afeb.  Rapid response nurse evaluated initial FHT strip and found to be normal without decels or contractions.  Pt has no hx of surgery or medical problems.  Agree with admitting pt for 24 hours at least of IV antibiotics and monitoring.  Vitals:   01/07/22 0842  BP: 128/73  Pulse: 88  Resp: 15  Temp: 98.3 F (36.8 C)  TempSrc: Oral  SpO2: 100%    EXAM: RENAL / URINARY TRACT ULTRASOUND COMPLETE   COMPARISON:  None.   FINDINGS: Right Kidney:   Renal measurements: 9.7 x 5.8 x 5.5 cm = volume: 161 mL. Normal echotexture. Mildly dilated collecting system. Prominent internal vascularity with color Doppler compared to the left kidney. No mass or calculi visualized.   Left Kidney:   Renal measurements: 11.2 x 5.8 x 4.8 cm = volume: 162 mL. Normal echotexture. 2.0 cm parapelvic cyst. No hydronephrosis.   Bladder:   Appears normal for degree of bladder distention.   Other:   None.   IMPRESSION: 1. Mild right hydronephrosis. This could be due to ureteral compression by the gravid uterus or a nonvisualized ureteral calculus. 2. Prominent internal vascularity in the right kidney compared to the left kidney. This could be due to the mild right hydronephrosis. Pyelonephritis can also produce this appearance. 3. Unremarkable left kidney and urinary bladder.     Electronically Signed   By: Beckie Salts M.D.   On: 01/07/2022 11:19  Loney Laurence       PA Note:  History      Chief Complaint  Patient presents with   Back Pain      Mary Hutchinson is a 26 y.o. female.   26 year old female presents at [redacted] weeks gestation with complaint of sudden onset right-sided back  pain at 6 AM today.  Pain is constant, worse with movement and palpation, associated with nausea.  Denies associated vomiting, changes in bowel or bladder habits, vaginal bleeding or discharge.  Patient has had routine prenatal care, no complications to date.  Plan to deliver at [facility]. States she is nauseous. No falls or injuries, no history of kidney stones.        Home Medications Prior to Admission medications   Not on File       Allergies            Patient has no known allergies.     Review of Systems   Review of Systems Negative except as per HPI Physical Exam Updated Vital Signs BP 128/73 (BP Location: Right Arm)    Pulse 88    Temp 98.3 F (36.8 C) (Oral)    Resp 15    LMP 06/28/2021 (Exact Date)    SpO2 100%  Physical Exam Vitals and nursing note reviewed.  Constitutional:      General: She is not in acute distress.    Appearance: She is well-developed. She is not diaphoretic.  HENT:     Head: Normocephalic and atraumatic.  Cardiovascular:     Rate and Rhythm: Normal rate and regular rhythm.     Heart sounds: Normal heart sounds.  Pulmonary:     Effort: Pulmonary effort is normal.  Breath sounds: Normal breath sounds.  Abdominal:     Palpations: Abdomen is soft.     Comments: gravid  Musculoskeletal:        General: Tenderness present.       Back:    Right lower leg: No edema.     Left lower leg: No edema.  Skin:    General: Skin is warm and dry.     Findings: No erythema or rash.  Neurological:     Mental Status: She is alert and oriented to person, place, and time.  Psychiatric:        Behavior: Behavior normal.      ED Results / Procedures / Treatments   Labs (all labs ordered are listed, but only abnormal results are displayed)      Labs Reviewed  COMPREHENSIVE METABOLIC PANEL - Abnormal; Notable for the following components:      Result Value     Sodium 133 (*)      CO2 21 (*)      Calcium 8.0 (*)      Total Protein 6.3 (*)       Albumin 3.0 (*)      All other components within normal limits  CBC WITH DIFFERENTIAL/PLATELET - Abnormal; Notable for the following components:    WBC 12.3 (*)      RBC 3.17 (*)      Hemoglobin 9.4 (*)      HCT 28.2 (*)      Neutro Abs 10.0 (*)      Abs Immature Granulocytes 0.10 (*)      All other components within normal limits  URINALYSIS, ROUTINE W REFLEX MICROSCOPIC - Abnormal; Notable for the following components:    APPearance CLOUDY (*)      Ketones, ur 5 (*)      Protein, ur 30 (*)      Leukocytes,Ua LARGE (*)      WBC, UA >50 (*)      Bacteria, UA RARE (*)      All other components within normal limits  URINE CULTURE  CULTURE, BLOOD (ROUTINE X 2)  CULTURE, BLOOD (ROUTINE X 2)  RESP PANEL BY RT-PCR (FLU A&B, COVID) ARPGX2      EKG None   Radiology  Imaging Results (Last 48 hours)  US RENAL   Result Date: 01/07/2022 CLINICAL DATA:  Flank pain.  Twenty-seven weeks pregnant. EXAM: RENAL / URINARY TRACT ULTRASOUND COMPLETE COMPARISON:  None. FINDINGS: Right Kidney: Renal measurements: 9.7 x 5.8 x 5.5 cm = volume: 161 mL. Normal echotexture. Mildly dilated collecting system. Prominent internal vascularity with color Doppler compared to the left kidney. No mass or calculi visualized. Left Kidney: Renal measurements: 11.2 x 5.8 x 4.8 cm = volume: 162 mL. Normal echotexture. 2.0 cm parapelvic cyst. No hydronephrosis. Bladder: Appears normal for degree of bladder distention. Other: None. IMPRESSION: 1. Mild right hydronephrosis. This could be due to ureteral compression by the gravid uterus or a nonvisualized ureteral calculus. 2. Prominent internal vascularity in the right kidney compared to the left kidney. This could be due to the mild right hydronephrosis. Pyelonephritis can also produce this appearance. 3. Unremarkable left kidney and urinary bladder. Electronically Signed   By: Beckie SaltsSteven  Reid M.D.   On: 01/07/2022 11:19       Procedures Procedures      Medications Ordered  in ED Medications  lidocaine (LIDODERM) 5 % 1 patch (1 patch Transdermal Patch Applied 01/07/22 0915)  cefTRIAXone (ROCEPHIN) 2 g in sodium  chloride 0.9 % 100 mL IVPB (has no administration in time range)  ondansetron (ZOFRAN) injection 4 mg (4 mg Intravenous Given 01/07/22 1036)      ED Course/ Medical Decision Making/ A&P                         Medical Decision Making Amount and/or Complexity of Data Reviewed Labs: ordered. Radiology: ordered.   Risk Prescription drug management. Decision regarding hospitalization.     This patient presents to the ED for concern of right flank pain with nausea in pregnancy, this involves an extensive number of treatment options, and is a complaint that carries with it a high risk of complications and morbidity.  The differential diagnosis includes but not limited to musculoskeletal pain, pyelonephritis, kidney stone, labor, complications of pregnancy including HELLP, cholelithiasis.      Co morbidities that complicate the patient evaluation   Pregnant      Additional history obtained:   External records from outside source obtained and reviewed including no recent relevant records     Lab Tests:   I Ordered, and personally interpreted labs.  The pertinent results include: CBC with mild leukocytosis at 12.3, increased neutrophils.  Hemoglobin 9.4.  CMP without significant findings.  Urinalysis with cloudy urine, positive for protein, ketones, leukocytes with rare bacteria.  Sent for culture     Imaging Studies ordered:   I ordered imaging studies including renal ultrasound I agree with the radiologist interpretation, mild right hydronephrosis, possibly due to ureteral compression by gravid uterus versus nonvisualized ureteral calculus, prominent internal vascularity on right possibly due to hydronephrosis versus pyelonephritis   Medicines ordered and prescription drug management:   I ordered medication including Lidoderm patch, Zofran for  pain and vomiting Reevaluation of the patient after these medicines showed that the patient improved I have reviewed the patients home medicines and have made adjustments as needed     Test Considered:   CT abdomen/pelvis, deferred to [place] as patient is pregnant      Critical Interventions:   OB mointoring by rapid response team who has cleared patient for further work up.      Consultations Obtained:   I requested consultation with Dr. Henderson Cloud, [facility] with [facility],  and discussed lab and imaging findings as well as pertinent plan - they recommend: admit to [facility] specialty care, agrees with abx. [facility] on arrival.     Problem List / ED Course:   26 yo female presents at [redacted] weeks gestation with complaint of right flank pain, sudden onset this morning as above. On exam, found to have right low back tenderness, no rash, abdomen soft and non tender. Denies vaginal bleeding/dc/leaking fluids. Patient was evaluated by rapid OB response team and cleared for further evaluation.  On recheck, patient with vomiting, will give Zofran and fluids.  CBC with mild leukocytosis, urinalysis contaminant although positive for leukocytes with bacteria, will send for culture.  Renal ultrasound concerning for dilated collecting system on right, plan is to treat for pyelonephritis.  Discussed with Dr. Freida Busman, ER attending.  Case discussed with OB who requests transfer to [facility] for admission to [facility] specialty care.  Patient updated on plan of care, agreeable.                 Final Clinical Impression(s) / ED Diagnoses Final diagnoses:  Flank pain in pregnant patient  Pyelonephritis affecting pregnancy in third trimester      Rx / DC  Orders ED Discharge Orders       None             Alden Hipp 01/07/22 1202           Note Details  Author Alden Hipp File Time 01/07/2022 12:02 PM  Author Type Physician Assistant Certified Status Cosign Needed  Last Editor Alden Hipp Specialty Emergency Medicine  [facility] Acct # [id number] Admit Date 01/07/2022

## 2022-01-07 NOTE — Progress Notes (Addendum)
Dr Zerita Boers updated on patient status, complaints and ER current POC.  In agreement with lab tests obtained.  Updated on reactive and reassuring NST for 27 2/[redacted] weeks gestation.  Keep appointment with Dr Nino Parsley on 01/11/22.  Please call office with questions or concerns prior to appointment on Tuesday.  Cleared by OB Service. ? ?Marvell Fuller RNC-OB, RROB [phone] ?

## 2022-01-07 NOTE — Progress Notes (Signed)
G1P0 at 27 2/7 weeks reports to [facility] with c/o "constant shooting pain in the right side of her back since last night"  Pain 8/10.  No bleeding or leaking noted.  Benign pregnancy course to this point.  ED working her up for UTI/sciatica.  Bloodwork and urine sample sent to lab. Receives [facility] with [facility]/Dr Nino Parsley.  Has an appointment on 3/7 with Dr Nino Parsley. ? ?Monitors applied for NST. ?

## 2022-01-08 ENCOUNTER — Other Ambulatory Visit: Payer: Self-pay

## 2022-01-08 MED ORDER — SULFAMETHOXAZOLE-TRIMETHOPRIM 800-160 MG PO TABS: 1.0000 | ORAL_TABLET | Freq: Two times a day (BID) | ORAL | 0 refills | Status: DC

## 2022-01-08 NOTE — Progress Notes (Signed)
26 y.o. G1P0 [redacted]w[redacted]d HD#1 admitted for Pyelonephritis affecting pregnancy in third trimester [O23.03] ?Flank pain in pregnant patient [O26.899, R10.9]. ? ?Patient reports that she is feeling much improved from yesterday.  Her flank pain is minimal.  She never had a fever or tachycardia.  Denies abdominal cramping or contractions.   ?She is requesting discharge to home today.  She is worried about her cat being home alone ? ?Vitals:  ? 01/07/22 1315 01/07/22 1448 01/07/22 1503 01/07/22 1942  ?BP: 119/67  (!) 106/59 (!) 112/54  ?Pulse: 90 76 88 100  ?Resp: 16 16  17   ?Temp: 98.6 ?F (37 ?C) 98.4 ?F (36.9 ?C) 97.9 ?F (36.6 ?C) 98.3 ?F (36.8 ?C)  ?TempSrc: Oral Oral Oral Oral  ?SpO2: 99% 97%  99%  ?Weight:  70.3 kg    ?Height:  5\' 4"  (1.626 m)    ? ? ?NAD ?Abdomen: soft, gravid, non-tender, no fundal tenderness ?Back: mild CVA tenderness ?Extremities: +SCDs ?NST pending from this AM ? ?Results for orders placed or performed during the hospital encounter of 01/07/22 (from the past 24 hour(s))  ?Comprehensive metabolic panel     Status: Abnormal  ? Collection Time: 01/07/22  9:21 AM  ?Result Value Ref Range  ? Sodium 133 (L) 135 - 145 mmol/L  ? Potassium 3.5 3.5 - 5.1 mmol/L  ? Chloride 105 98 - 111 mmol/L  ? CO2 21 (L) 22 - 32 mmol/L  ? Glucose, Bld 85 70 - 99 mg/dL  ? BUN 11 6 - 20 mg/dL  ? Creatinine, Ser 0.49 0.44 - 1.00 mg/dL  ? Calcium 8.0 (L) 8.9 - 10.3 mg/dL  ? Total Protein 6.3 (L) 6.5 - 8.1 g/dL  ? Albumin 3.0 (L) 3.5 - 5.0 g/dL  ? AST 26 15 - 41 U/L  ? ALT 30 0 - 44 U/L  ? Alkaline Phosphatase 64 38 - 126 U/L  ? Total Bilirubin 0.3 0.3 - 1.2 mg/dL  ? GFR, Estimated >60 >60 mL/min  ? Anion gap 7 5 - 15  ?CBC with Differential     Status: Abnormal  ? Collection Time: 01/07/22  9:21 AM  ?Result Value Ref Range  ? WBC 12.3 (H) 4.0 - 10.5 K/uL  ? RBC 3.17 (L) 3.87 - 5.11 MIL/uL  ? Hemoglobin 9.4 (L) 12.0 - 15.0 g/dL  ? HCT 28.2 (L) 36.0 - 46.0 %  ? MCV 89.0 80.0 - 100.0 fL  ? MCH 29.7 26.0 - 34.0 pg  ? MCHC 33.3 30.0 -  36.0 g/dL  ? RDW 13.3 11.5 - 15.5 %  ? Platelets 252 150 - 400 K/uL  ? nRBC 0.0 0.0 - 0.2 %  ? Neutrophils Relative % 81 %  ? Neutro Abs 10.0 (H) 1.7 - 7.7 K/uL  ? Lymphocytes Relative 12 %  ? Lymphs Abs 1.4 0.7 - 4.0 K/uL  ? Monocytes Relative 5 %  ? Monocytes Absolute 0.7 0.1 - 1.0 K/uL  ? Eosinophils Relative 1 %  ? Eosinophils Absolute 0.1 0.0 - 0.5 K/uL  ? Basophils Relative 0 %  ? Basophils Absolute 0.0 0.0 - 0.1 K/uL  ? Immature Granulocytes 1 %  ? Abs Immature Granulocytes 0.10 (H) 0.00 - 0.07 K/uL  ?Urinalysis, Routine w reflex microscopic Urine, Clean Catch     Status: Abnormal  ? Collection Time: 01/07/22  9:21 AM  ?Result Value Ref Range  ? Color, Urine YELLOW YELLOW  ? APPearance CLOUDY (A) CLEAR  ? Specific Gravity, Urine 1.016 1.005 - 1.030  ?  pH 6.0 5.0 - 8.0  ? Glucose, UA NEGATIVE NEGATIVE mg/dL  ? Hgb urine dipstick NEGATIVE NEGATIVE  ? Bilirubin Urine NEGATIVE NEGATIVE  ? Ketones, ur 5 (A) NEGATIVE mg/dL  ? Protein, ur 30 (A) NEGATIVE mg/dL  ? Nitrite NEGATIVE NEGATIVE  ? Leukocytes,Ua LARGE (A) NEGATIVE  ? RBC / HPF 11-20 0 - 5 RBC/hpf  ? WBC, UA >50 (H) 0 - 5 WBC/hpf  ? Bacteria, UA RARE (A) NONE SEEN  ? Squamous Epithelial / LPF 11-20 0 - 5  ? Mucus PRESENT   ?Urine Culture     Status: None (Preliminary result)  ? Collection Time: 01/07/22 10:10 AM  ? Specimen: Urine, Clean Catch  ?Result Value Ref Range  ? Specimen Description    ?  URINE, CLEAN CATCH ?Performed at [facility], [place] [address] [place], [place] [address] ?  ? Special Requests    ?  NONE ?Performed at [facility], [place] [address] [place], [place] [address] ?  ? Culture    ?  CULTURE REINCUBATED FOR BETTER GROWTH ?Performed at [facility], [place] [address], [place], [place] [address] ?  ? Report Status PENDING   ?Resp Panel by RT-PCR (Flu A&B, Covid) Nasopharyngeal Swab     Status: None  ? Collection Time: 01/07/22  1:43 PM  ? Specimen: Nasopharyngeal Swab; Nasopharyngeal(NP) swabs in  vial transport medium  ?Result Value Ref Range  ? SARS Coronavirus 2 by RT PCR NEGATIVE NEGATIVE  ? Influenza A by PCR NEGATIVE NEGATIVE  ? Influenza B by PCR NEGATIVE NEGATIVE  ? ? ?A:  HD#1  [redacted]w[redacted]d with mild pyelonephritis. ? ?P: ?Urine culture pending.  Has been afebrile since admission.  Very mild elevation of WBCs at 12.3.  Discussed observation for another 24 hours to allow urine culture to result and aid in PO antibiotic selection.  She is requesting discharge home today.  Will permit PM discharge after 2nd dose of IV rocephin.  As cultures still pending, will plan 10 day course of PO bactrim.  Precautions reviewed.  Patient verbalizes understanding of when to return to MAU for repeat evaluation ? ?[place] ? ? ?

## 2022-01-08 NOTE — Discharge Summary (Signed)
Physician Discharge Summary  ?Patient ID: ?Mary Hutchinson ?MRN: [id number] ?DOB/AGE: [birth year] 26 y.o. ? ?Admit date: 01/07/2022 ?Discharge date: 01/08/2022 ? ?Admission Diagnoses: pyelonephritis in second trimester ? ?Discharge Diagnoses:  ?Principal Problem: ?  Pyelonephritis affecting pregnancy in third trimester ? ? ?Discharged Condition: good ? ?Hospital Course: G1P0 at [redacted]w[redacted]d presented to [facility] with flank pain and urinary urgency for 6 hours.  Uncomplicated pregnancy to date.  In ED, she was noted to have RCVA tenderness, slightly elevated WBC, renal US findings with possible mild pyelonephritis or mild hydronephrosis.  UA with Many WBCs .  Admission was recommended.  She was started on IV rocephin.  She remained afebrile with normal vital signs throughout her hospitalization.  On HD#2, she requested discharge to home.  Urine culture was still pending.  Urinary urgency / frequency had resolved.  Back pain was much improved.  She denied contractions or cramping.  She received a second dose of IV rocephin and was transitioned to PO bactrim pending final urine culture. She verbalized understanding on the limitation of antibiotic selection with urine culture pending.  ?Consults: None ? ?Significant Diagnostic Studies: radiology: Ultrasound: renal US as above ? ?Treatments: IV hydration and antibiotics: ceftriaxone ? ?Discharge Exam: ?Blood pressure (!) 105/52, pulse (!) 101, temperature 97.9 ?F (36.6 ?C), temperature source Oral, resp. rate 16, height 5\' 4"  (1.626 m), weight 70.3 kg, last menstrual period 06/28/2021, SpO2 99 %. ?General appearance: alert, cooperative, and appears stated age ?Back: very mild right CVA tenderness ?Abdomen: gravid, nontender ? ?Disposition: Discharge disposition: 01-Home or Self Care ? ? ? ? ? ? ?Discharge Instructions   ? ? Discharge activity:  No Restrictions   Complete by: As directed ?  ? Discharge diet:  No restrictions   Complete by: As directed ?  ? Discharge instructions   Complete  by: As directed ?  ? Please call office if you have worsening of flank pain, fever > 101F, vomiting or any other concerns  ? Notify physician for a general feeling that "something is not right"   Complete by: As directed ?  ? Notify physician for increase or change in vaginal discharge   Complete by: As directed ?  ? Notify physician for intestinal cramps, with or without diarrhea, sometimes described as "gas pain"   Complete by: As directed ?  ? Notify physician for leaking of fluid   Complete by: As directed ?  ? Notify physician for low, dull backache, unrelieved by heat or Tylenol   Complete by: As directed ?  ? Notify physician for menstrual like cramps   Complete by: As directed ?  ? Notify physician for pelvic pressure   Complete by: As directed ?  ? Notify physician for uterine contractions.  These may be painless and feel like the uterus is tightening or the baby is  "balling up"   Complete by: As directed ?  ? Notify physician for vaginal bleeding   Complete by: As directed ?  ? PRETERM LABOR:  Includes any of the follwing symptoms that occur between 20 - [redacted] weeks gestation.  If these symptoms are not stopped, preterm labor can result in preterm delivery, placing your baby at risk   Complete by: As directed ?  ? ?  ? ?Allergies as of 01/08/2022   ?No Known Allergies ?  ? ?  ?Medication List  ?  ? ?TAKE these medications   ? ?sulfamethoxazole-trimethoprim 800-160 MG tablet ?Commonly known as: BACTRIM DS ?Take 1 tablet by mouth 2 (  two) times daily. ?  ? ?  ? ? Follow-up Information   ? ? Paula Compton, MD Follow up.   ?Specialty: Obstetrics and Gynecology ?Why: keep scheduled appointment on 01/11/22 with Dr. Marvel Plan ?Contact information: ?[place] ?[address] ?[place] [address] ?[phone] ? ? ?  ?  ? ?  ?  ? ?  ? ? ?Signed: ?[place] ?01/08/2022, 9:08 AM ? ? ?

## 2022-01-09 LAB — URINE CULTURE: Culture: >=100000 — AB

## 2022-01-11 NOTE — Progress Notes (Signed)
Not sure why this came to my inbox. The blood and urine cultures appear to have been ordered by Army Melia PA in the ED.

## 2022-01-12 LAB — CULTURE, BLOOD (ROUTINE X 2)
Culture: NO GROWTH
Culture: NO GROWTH
Culture: NO GROWTH
Special Requests: ADEQUATE
Special Requests: ADEQUATE
Special Requests: ADEQUATE

## 2022-01-12 MED FILL — Hydromorphone HCl Inj 1 MG/ML: INTRAMUSCULAR | Qty: 1 | Status: AC

## 2022-03-01 ENCOUNTER — Other Ambulatory Visit (HOSPITAL_COMMUNITY): Payer: Self-pay | Admitting: Obstetrics and Gynecology

## 2022-03-01 ENCOUNTER — Encounter (HOSPITAL_COMMUNITY): Payer: Self-pay

## 2022-03-01 ENCOUNTER — Ambulatory Visit (HOSPITAL_COMMUNITY)
Admission: RE | Admit: 2022-03-01 | Discharge: 2022-03-01 | Disposition: A | Discharge status: Home / Self Care | Payer: 59 | Source: Ambulatory Visit | Attending: Obstetrics and Gynecology | Admitting: Obstetrics and Gynecology

## 2022-03-01 DIAGNOSIS — M79604 Pain in right leg: Secondary | ICD-10-CM

## 2022-03-01 DIAGNOSIS — M7989 Other specified soft tissue disorders: Secondary | ICD-10-CM | POA: Diagnosis present

## 2022-03-01 NOTE — Progress Notes (Signed)
Lower extremity venous has been completed.  ? ?Preliminary results in CV Proc.  ? ?Mary Hutchinson Mary Hutchinson ?03/01/2022 4:48 PM    ?

## 2022-03-05 ENCOUNTER — Inpatient Hospital Stay (EMERGENCY_DEPARTMENT_HOSPITAL)
Admission: AD | Admit: 2022-03-05 | Discharge: 2022-03-05 | Disposition: A | Discharge status: Home / Self Care | Payer: 59 | Source: Home / Self Care | Attending: Obstetrics and Gynecology | Admitting: Obstetrics and Gynecology

## 2022-03-05 ENCOUNTER — Encounter (HOSPITAL_COMMUNITY): Payer: Self-pay | Admitting: Obstetrics and Gynecology

## 2022-03-05 ENCOUNTER — Inpatient Hospital Stay (HOSPITAL_COMMUNITY)
Admission: AD | Admit: 2022-03-05 | Discharge: 2022-03-08 | DRG: 807 | Disposition: A | Discharge status: Home / Self Care | Payer: 59 | Attending: Obstetrics and Gynecology | Admitting: Obstetrics and Gynecology

## 2022-03-05 ENCOUNTER — Inpatient Hospital Stay (HOSPITAL_COMMUNITY): Payer: 59 | Admitting: Anesthesiology

## 2022-03-05 DIAGNOSIS — O4703 False labor before 37 completed weeks of gestation, third trimester: Secondary | ICD-10-CM | POA: Insufficient documentation

## 2022-03-05 DIAGNOSIS — O479 False labor, unspecified: Secondary | ICD-10-CM

## 2022-03-05 DIAGNOSIS — Z3A35 35 weeks gestation of pregnancy: Secondary | ICD-10-CM | POA: Insufficient documentation

## 2022-03-05 DIAGNOSIS — D509 Iron deficiency anemia, unspecified: Secondary | ICD-10-CM | POA: Diagnosis present

## 2022-03-05 DIAGNOSIS — Z87891 Personal history of nicotine dependence: Secondary | ICD-10-CM | POA: Diagnosis not present

## 2022-03-05 DIAGNOSIS — O9902 Anemia complicating childbirth: Secondary | ICD-10-CM | POA: Diagnosis present

## 2022-03-05 DIAGNOSIS — O4700 False labor before 37 completed weeks of gestation, unspecified trimester: Secondary | ICD-10-CM | POA: Diagnosis present

## 2022-03-05 HISTORY — DX: Chronic kidney disease, unspecified: N18.9

## 2022-03-05 LAB — WET PREP, GENITAL
Clue Cells Wet Prep HPF POC: NONE SEEN
Sperm: NONE SEEN
Trich, Wet Prep: NONE SEEN
WBC, Wet Prep HPF POC: >=10 — AB (ref <10)
Yeast Wet Prep HPF POC: NONE SEEN

## 2022-03-05 LAB — URINALYSIS, ROUTINE W REFLEX MICROSCOPIC
Bilirubin Urine: NEGATIVE
Glucose, UA: NEGATIVE mg/dL
Hgb urine dipstick: NEGATIVE
Ketones, ur: NEGATIVE mg/dL
Leukocytes,Ua: NEGATIVE
Nitrite: NEGATIVE
Protein, ur: NEGATIVE mg/dL
Specific Gravity, Urine: 1.004 — ABNORMAL LOW (ref 1.005–1.030)
pH: 8 (ref 5.0–8.0)

## 2022-03-05 LAB — TYPE AND SCREEN
ABO/RH(D): AB POS
Antibody Screen: NEGATIVE

## 2022-03-05 LAB — CBC
HCT: 28.1 % — ABNORMAL LOW (ref 36.0–46.0)
Hemoglobin: 8.7 g/dL — ABNORMAL LOW (ref 12.0–15.0)
MCH: 26.3 pg (ref 26.0–34.0)
MCHC: 31 g/dL (ref 30.0–36.0)
MCV: 84.9 fL (ref 80.0–100.0)
Platelets: 244 10*3/uL (ref 150–400)
RBC: 3.31 MIL/uL — ABNORMAL LOW (ref 3.87–5.11)
RDW: 14.1 % (ref 11.5–15.5)
WBC: 15.1 10*3/uL — ABNORMAL HIGH (ref 4.0–10.5)
nRBC: 0 % (ref 0.0–0.2)

## 2022-03-05 LAB — GROUP B STREP BY PCR: Group B strep by PCR: NEGATIVE

## 2022-03-05 MED ORDER — FENTANYL-BUPIVACAINE-NACL 0.5-0.125-0.9 MG/250ML-% EP SOLN
EPIDURAL | Status: DC | PRN
  Administered 2022-03-05: 12 mL/h via EPIDURAL

## 2022-03-05 MED ORDER — LACTATED RINGERS IV SOLN
500.0000 mL | INTRAVENOUS | Status: DC | PRN
  Administered 2022-03-05: 500 mL via INTRAVENOUS

## 2022-03-05 MED ORDER — LACTATED RINGERS IV SOLN
500.0000 mL | Freq: Once | INTRAVENOUS | Status: AC
  Administered 2022-03-05: 500 mL via INTRAVENOUS

## 2022-03-05 MED ORDER — PENICILLIN G POT IN DEXTROSE 60000 UNIT/ML IV SOLN: 3.0000 10*6.[IU] | INTRAVENOUS | Status: DC

## 2022-03-05 MED ORDER — ONDANSETRON HCL 4 MG/2ML IJ SOLN: 4.0000 mg | Freq: Four times a day (QID) | INTRAMUSCULAR | Status: DC | PRN

## 2022-03-05 MED ORDER — LIDOCAINE HCL (PF) 1 % IJ SOLN
INTRAMUSCULAR | Status: DC | PRN
  Administered 2022-03-05: 10 mL via EPIDURAL

## 2022-03-05 MED ORDER — LIDOCAINE HCL (PF) 1 % IJ SOLN: 30.0000 mL | INTRAMUSCULAR | Status: DC | PRN

## 2022-03-05 MED ORDER — PHENYLEPHRINE 80 MCG/ML (10ML) SYRINGE FOR IV PUSH (FOR BLOOD PRESSURE SUPPORT)
80.0000 ug | PREFILLED_SYRINGE | INTRAVENOUS | Status: DC | PRN
  Filled 2022-03-05: qty 10

## 2022-03-05 MED ORDER — TERBUTALINE SULFATE 1 MG/ML IJ SOLN
0.2500 mg | Freq: Once | INTRAMUSCULAR | Status: AC
  Administered 2022-03-05: 0.25 mg via SUBCUTANEOUS
  Filled 2022-03-05: qty 1

## 2022-03-05 MED ORDER — SODIUM CHLORIDE 0.9 % IV SOLN
5.0000 10*6.[IU] | Freq: Once | INTRAVENOUS | Status: AC
  Administered 2022-03-05: 5 10*6.[IU] via INTRAVENOUS
  Filled 2022-03-05 (×2): qty 5

## 2022-03-05 MED ORDER — LACTATED RINGERS IV BOLUS
1000.0000 mL | Freq: Once | INTRAVENOUS | Status: AC
  Administered 2022-03-05: 1000 mL via INTRAVENOUS

## 2022-03-05 MED ORDER — FENTANYL CITRATE (PF) 100 MCG/2ML IJ SOLN: 50.0000 ug | INTRAMUSCULAR | Status: DC | PRN

## 2022-03-05 MED ORDER — ACETAMINOPHEN 325 MG PO TABS: 650.0000 mg | ORAL_TABLET | ORAL | Status: DC | PRN

## 2022-03-05 MED ORDER — PHENYLEPHRINE 80 MCG/ML (10ML) SYRINGE FOR IV PUSH (FOR BLOOD PRESSURE SUPPORT)
80.0000 ug | PREFILLED_SYRINGE | INTRAVENOUS | Status: DC | PRN
  Administered 2022-03-05: 80 ug via INTRAVENOUS

## 2022-03-05 MED ORDER — OXYTOCIN BOLUS FROM INFUSION
333.0000 mL | Freq: Once | INTRAVENOUS | Status: AC
  Administered 2022-03-06: 333 mL via INTRAVENOUS

## 2022-03-05 MED ORDER — EPHEDRINE 5 MG/ML INJ: 10.0000 mg | INTRAVENOUS | Status: DC | PRN

## 2022-03-05 MED ORDER — OXYCODONE-ACETAMINOPHEN 5-325 MG PO TABS: 1.0000 | ORAL_TABLET | ORAL | Status: DC | PRN

## 2022-03-05 MED ORDER — OXYCODONE-ACETAMINOPHEN 5-325 MG PO TABS: 2.0000 | ORAL_TABLET | ORAL | Status: DC | PRN

## 2022-03-05 MED ORDER — FENTANYL CITRATE (PF) 100 MCG/2ML IJ SOLN
100.0000 ug | Freq: Once | INTRAMUSCULAR | Status: AC
  Administered 2022-03-05: 100 ug via INTRAVENOUS
  Filled 2022-03-05: qty 2

## 2022-03-05 MED ORDER — OXYTOCIN-SODIUM CHLORIDE 30-0.9 UT/500ML-% IV SOLN
2.5000 [IU]/h | INTRAVENOUS | Status: DC
  Filled 2022-03-05 (×2): qty 500

## 2022-03-05 MED ORDER — LACTATED RINGERS IV SOLN: INTRAVENOUS | Status: DC

## 2022-03-05 MED ORDER — FENTANYL-BUPIVACAINE-NACL 0.5-0.125-0.9 MG/250ML-% EP SOLN
12.0000 mL/h | EPIDURAL | Status: DC | PRN
  Filled 2022-03-05: qty 250

## 2022-03-05 MED ORDER — SOD CITRATE-CITRIC ACID 500-334 MG/5ML PO SOLN: 30.0000 mL | ORAL | Status: DC | PRN

## 2022-03-05 MED ORDER — DIPHENHYDRAMINE HCL 50 MG/ML IJ SOLN: 12.5000 mg | INTRAMUSCULAR | Status: DC | PRN

## 2022-03-05 NOTE — MAU Note (Signed)
Mary Hutchinson is a 26 y.o. at [redacted]w[redacted]d here in MAU reporting: since 0700, keeps having cramping, started like a soft cramp in lower belly, lasting 5-10 seconds.  As the day has gone on they are coming more frequent and lasting longer. Denies bleeding or LOF.  Reports +FM ?Onset of complaint: 0700 ?Pain score: mild/mod ?Vitals:  ? 03/05/22 1351  ?BP: 121/81  ?Pulse: 87  ?Resp: 16  ?Temp: 98.6 ?F (37 ?C)  ?SpO2: 100%  ?   ?FHT:120 ?Lab orders placed from triage:  urine ?

## 2022-03-05 NOTE — MAU Provider Note (Signed)
CC:  ?Chief Complaint  ?Patient presents with  ? Abdominal Pain  ? ? ? Event Date/Time  ? First Provider Initiated Contact with Patient 03/05/22 1441   ?  ? ?HPI: Mary Hutchinson is a 26 y.o. year old G90P0000 female at [redacted]w[redacted]d weeks gestation who presents to MAU reporting contractions every 5 minutes since 7 AM that have worsened throughout the day. ? ?Associated Sx: Negative for urinary complaints, GI complaints, vaginal discharge. ?Vaginal bleeding: Denies ?Leaking of fluid: Denies ?Fetal movement: Normal ? ?O: Patient Vitals for the past 24 hrs: ? BP Temp Temp src Pulse Resp SpO2 Height Weight  ?03/05/22 1414 120/75 -- -- 86 16 94 % -- --  ?03/05/22 1351 121/81 98.6 ?F (37 ?C) Oral 87 16 100 % 5\' 4"  (1.626 m) 74.3 kg  ? ? ?General: NAD ?Heart: Regular rate ?Lungs: Normal rate and effort ?Abd: Soft, NT, Gravid, S=D ?Pelvic: NEFG, negative leaking of fluid or blood.  ?Dilation: 1 ?Effacement (%): 80 ?Cervical Position: Posterior ?Station: -2 ?Presentation: Vertex ?Exam by:: Daneil Dan, RN ? ?EFM: 125, Moderate variability, 15 x 15 accelerations, no decelerations ?Toco: Contractions every 3-5 minutes, mild-moderate ? ?MAU course ?Orders Placed This Encounter  ?Procedures  ? Wet prep, genital  ? Culture, beta strep (group b only)  ? Urinalysis, Routine w reflex microscopic Urine, Clean Catch  ? Insert peripheral IV  ? ?Meds ordered this encounter  ?Medications  ? lactated ringers bolus 1,000 mL  ? ?1528: Contracting frequently--Q 2-5 minutes.  No improvement with p.o. fluids.  Cervix 1/80/-2.  We will give IV bolus and recheck cervix. ? ?1706: Contractions have spaced out, still moderate.  No cervical change.  No evidence of active preterm labor, UTI or BV.  GBS collected due to concern that patient may deliver prior to 37 weeks. ? ? ?A: [redacted]w[redacted]d week IUP ?[address] ?FHR reactive ? ? ?P: Discharge home in stable condition. ?Preterm labor precautions and fetal kick counts. ?Increase fluids and rest. ?GBS  pending ?Follow-up as scheduled for prenatal visit or sooner as needed if symptoms worsen. ?Return to maternity admissions as needed if symptoms worsen. ? ?Tamala Julian, [place], [place] ?03/05/2022 ?3:32 PM ? ?3 ? ? ? ? ?

## 2022-03-05 NOTE — MAU Provider Note (Signed)
Event Date/Time  ? First Provider Initiated Contact with Patient 03/05/22 2003   ?  ?S: Ms. Mary Hutchinson is a 26 y.o. G1P0000 at [redacted]w[redacted]d  who presents to MAU today complaining contractions q 3 minutes since earlier today. She endorses vaginal bleeding/ bloody show.  She denies LOF. She reports normal fetal movement.   ? ?Patient crying with discomfort.  ? ?O: BP 127/78 (BP Location: Right Arm)   Pulse 83   Temp 98.3 ?F (36.8 ?C) (Oral)   Resp 18   Ht 5\' 4"  (1.626 m)   Wt 73.5 kg   LMP 06/28/2021 (Exact Date)   SpO2 98%   BMI 27.81 kg/m?  ?GENERAL: Well-developed, well-nourished female in no acute distress.  ?HEAD: Normocephalic, atraumatic.  ?CHEST: Normal effort of breathing, regular heart rate ?ABDOMEN: Soft, nontender, gravid ? ?Cervical exam:  ?Dilation: 3 ?Effacement (%): 80 ?Exam by:: [email or web address], NP ?Membranes palpated, bloody show.  ? ? ?Fetal Monitoring: ?Baseline: 125 bpm ?Variability: Moderate  ?Accelerations: 15x15 ?Decelerations: None ?Contractions: Difficult to determine.  ? ? ?A: ?SIUP at [redacted]w[redacted]d  ?Active labor ? ? ?P: ? ?Discussed patient with Dr. [redacted]w[redacted]d ?PCR GBS requested by Dr. Timothy Lasso ?Fentanyl IV per patient request ? ? ?Timothy Lasso, NP ?03/05/2022 8:22 PM  ?

## 2022-03-05 NOTE — Anesthesia Procedure Notes (Signed)
Epidural ?Patient location during procedure: OB ?Start time: 03/05/2022 9:41 PM ?End time: 03/05/2022 9:49 PM ? ?Staffing ?Anesthesiologist: Lannie Fields, DO ?Performed: anesthesiologist  ? ?Preanesthetic Checklist ?Completed: patient identified, IV checked, risks and benefits discussed, monitors and equipment checked, pre-op evaluation and timeout performed ? ?Epidural ?Patient position: sitting ?Prep: DuraPrep and site prepped and draped ?Patient monitoring: continuous pulse ox, blood pressure, heart rate and cardiac monitor ?Approach: midline ?Location: L3-L4 ?Injection technique: LOR air ? ?Needle:  ?Needle type: Tuohy  ?Needle gauge: 17 G ?Needle length: 9 cm ?Needle insertion depth: 6 cm ?Catheter type: closed end flexible ?Catheter size: 19 Gauge ?Catheter at skin depth: 11 cm ?Test dose: negative ? ?Assessment ?Sensory level: T8 ?Events: blood not aspirated, injection not painful, no injection resistance, no paresthesia and negative IV test ? ?Additional Notes ?Patient identified. Risks/Benefits/Options discussed with patient including but not limited to bleeding, infection, nerve damage, paralysis, failed block, incomplete pain control, headache, blood pressure changes, nausea, vomiting, reactions to medication both or allergic, itching and postpartum back pain. Confirmed with bedside nurse the patient's most recent platelet count. Confirmed with patient that they are not currently taking any anticoagulation, have any bleeding history or any family history of bleeding disorders. Patient expressed understanding and wished to proceed. All questions were answered. Sterile technique was used throughout the entire procedure. Please see nursing notes for vital signs. Test dose was given through epidural catheter and negative prior to continuing to dose epidural or start infusion. Warning signs of high block given to the patient including shortness of breath, tingling/numbness in hands, complete motor  block, or any concerning symptoms with instructions to call for help. Patient was given instructions on fall risk and not to get out of bed. All questions and concerns addressed with instructions to call with any issues or inadequate analgesia.  Reason for block:procedure for pain ? ? ? ?
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
## 2022-03-05 NOTE — MAU Note (Signed)
.  Mary Hutchinson is a 26 y.o. at [redacted]w[redacted]d here in MAU reporting: contractions have intensified in strength and frequency. Pt denies SROM, vaginal bleeding or bloody show.  Endorses + fetal movement but states it has slowed down from this morning. ? ?Onset of complaint: 1800 ?Pain score: 9 ?Vitals:  ? 03/05/22 1930  ?BP: 114/74  ?Pulse: 98  ?Resp: 18  ?Temp: 98.3 ?F (36.8 ?C)  ?SpO2: 98%  ?   ?FHT:138bpm ?  ?

## 2022-03-05 NOTE — Anesthesia Preprocedure Evaluation (Signed)
Anesthesia Evaluation  ?Patient identified by MRN, date of birth, ID band ?Patient awake ? ? ? ?Reviewed: ?Allergy & Precautions, Patient's Chart, lab work & pertinent test results ? ?Airway ?Mallampati: II ? ?TM Distance: >3 FB ?Neck ROM: Full ? ? ? Dental ?no notable dental hx. ? ?  ?Pulmonary ?neg pulmonary ROS,  ?  ?Pulmonary exam normal ?breath sounds clear to auscultation ? ? ? ? ? ? Cardiovascular ?negative cardio ROS ?Normal cardiovascular exam ?Rhythm:Regular Rate:Normal ? ? ?  ?Neuro/Psych ?negative neurological ROS ? negative psych ROS  ? GI/Hepatic ?negative GI ROS, Neg liver ROS,   ?Endo/Other  ?negative endocrine ROS ? Renal/GU ?negative Renal ROS  ?negative genitourinary ?  ?Musculoskeletal ?negative musculoskeletal ROS ?(+)  ? Abdominal ?  ?Peds ?negative pediatric ROS ?(+)  Hematology ? ?(+) Blood dyscrasia, anemia , Hb 8.7, plt 244   ?Anesthesia Other Findings ? ? Reproductive/Obstetrics ?(+) Pregnancy ? ?  ? ? ? ? ? ? ? ? ? ? ? ? ? ?  ?  ? ? ? ? ? ? ? ? ?Anesthesia Physical ?Anesthesia Plan ? ?ASA: 2 ? ?Anesthesia Plan: Epidural  ? ?Post-op Pain Management:   ? ?Induction:  ? ?PONV Risk Score and Plan: 2 ? ?Airway Management Planned: Natural Airway ? ?Additional Equipment: None ? ?Intra-op Plan:  ? ?Post-operative Plan:  ? ?Informed Consent: I have reviewed the patients History and Physical, chart, labs and discussed the procedure including the risks, benefits and alternatives for the proposed anesthesia with the patient or authorized representative who has indicated his/her understanding and acceptance.  ? ? ? ? ? ?Plan Discussed with:  ? ?Anesthesia Plan Comments:   ? ? ? ? ? ? ?Anesthesia Quick Evaluation ? ?
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
## 2022-03-06 ENCOUNTER — Encounter (HOSPITAL_COMMUNITY): Payer: Self-pay | Admitting: Obstetrics and Gynecology

## 2022-03-06 LAB — CBC
HCT: 25.4 % — ABNORMAL LOW (ref 36.0–46.0)
Hemoglobin: 8.1 g/dL — ABNORMAL LOW (ref 12.0–15.0)
MCH: 26.9 pg (ref 26.0–34.0)
MCHC: 31.9 g/dL (ref 30.0–36.0)
MCV: 84.4 fL (ref 80.0–100.0)
Platelets: 207 10*3/uL (ref 150–400)
RBC: 3.01 MIL/uL — ABNORMAL LOW (ref 3.87–5.11)
RDW: 14 % (ref 11.5–15.5)
WBC: 18 10*3/uL — ABNORMAL HIGH (ref 4.0–10.5)
nRBC: 0 % (ref 0.0–0.2)

## 2022-03-06 LAB — RPR: RPR Ser Ql: NONREACTIVE

## 2022-03-06 MED ORDER — DOCUSATE SODIUM 100 MG PO CAPS
100.0000 mg | ORAL_CAPSULE | Freq: Two times a day (BID) | ORAL | Status: DC
  Filled 2022-03-06 (×3): qty 1

## 2022-03-06 MED ORDER — ALBUTEROL SULFATE (2.5 MG/3ML) 0.083% IN NEBU: 2.5000 mg | INHALATION_SOLUTION | Freq: Once | RESPIRATORY_TRACT | Status: DC | PRN

## 2022-03-06 MED ORDER — PRENATAL MULTIVITAMIN CH
1.0000 | ORAL_TABLET | Freq: Every day | ORAL | Status: DC
  Filled 2022-03-06: qty 1

## 2022-03-06 MED ORDER — WITCH HAZEL-GLYCERIN EX PADS: 1.0000 "application " | MEDICATED_PAD | CUTANEOUS | Status: DC | PRN

## 2022-03-06 MED ORDER — DIBUCAINE (PERIANAL) 1 % EX OINT: 1.0000 "application " | TOPICAL_OINTMENT | CUTANEOUS | Status: DC | PRN

## 2022-03-06 MED ORDER — DIPHENHYDRAMINE HCL 50 MG/ML IJ SOLN: 25.0000 mg | Freq: Once | INTRAMUSCULAR | Status: DC | PRN

## 2022-03-06 MED ORDER — ONDANSETRON HCL 4 MG PO TABS: 4.0000 mg | ORAL_TABLET | ORAL | Status: DC | PRN

## 2022-03-06 MED ORDER — OXYCODONE HCL 5 MG PO TABS: 10.0000 mg | ORAL_TABLET | ORAL | Status: DC | PRN

## 2022-03-06 MED ORDER — OXYCODONE HCL 5 MG PO TABS: 5.0000 mg | ORAL_TABLET | ORAL | Status: DC | PRN

## 2022-03-06 MED ORDER — IBUPROFEN 600 MG PO TABS
600.0000 mg | ORAL_TABLET | Freq: Four times a day (QID) | ORAL | Status: DC
  Filled 2022-03-06 (×4): qty 1

## 2022-03-06 MED ORDER — SODIUM CHLORIDE 0.9 % IV SOLN
500.0000 mg | Freq: Once | INTRAVENOUS | Status: AC
  Administered 2022-03-06: 500 mg via INTRAVENOUS
  Filled 2022-03-06: qty 25

## 2022-03-06 MED ORDER — BISACODYL 10 MG RE SUPP
10.0000 mg | Freq: Every day | RECTAL | Status: DC | PRN
Start: 2022-03-06 — End: 2022-03-08

## 2022-03-06 MED ORDER — SODIUM CHLORIDE 0.9 % IV SOLN: INTRAVENOUS | Status: DC | PRN

## 2022-03-06 MED ORDER — METHYLPREDNISOLONE SODIUM SUCC 125 MG IJ SOLR: 125.0000 mg | Freq: Once | INTRAMUSCULAR | Status: DC | PRN

## 2022-03-06 MED ORDER — FLEET ENEMA 7-19 GM/118ML RE ENEM: 1.0000 | ENEMA | Freq: Every day | RECTAL | Status: DC | PRN

## 2022-03-06 MED ORDER — ACETAMINOPHEN 325 MG PO TABS: 650.0000 mg | ORAL_TABLET | ORAL | Status: DC | PRN

## 2022-03-06 MED ORDER — EPINEPHRINE PF 1 MG/ML IJ SOLN
0.3000 mg | Freq: Once | INTRAMUSCULAR | Status: DC | PRN
  Filled 2022-03-06: qty 1

## 2022-03-06 MED ORDER — DIPHENHYDRAMINE HCL 25 MG PO CAPS: 25.0000 mg | ORAL_CAPSULE | Freq: Four times a day (QID) | ORAL | Status: DC | PRN

## 2022-03-06 MED ORDER — COCONUT OIL OIL: 1.0000 "application " | TOPICAL_OIL | Status: DC | PRN

## 2022-03-06 MED ORDER — ONDANSETRON HCL 4 MG/2ML IJ SOLN: 4.0000 mg | INTRAMUSCULAR | Status: DC | PRN

## 2022-03-06 MED ORDER — TETANUS-DIPHTH-ACELL PERTUSSIS 5-2.5-18.5 LF-MCG/0.5 IM SUSY
0.5000 mL | PREFILLED_SYRINGE | Freq: Once | INTRAMUSCULAR | Status: DC
Start: 2022-03-07 — End: 2022-03-08

## 2022-03-06 MED ORDER — SIMETHICONE 80 MG PO CHEW
80.0000 mg | CHEWABLE_TABLET | ORAL | Status: DC | PRN
Start: 2022-03-06 — End: 2022-03-08

## 2022-03-06 MED ORDER — SODIUM CHLORIDE 0.9 % IV BOLUS: 500.0000 mL | Freq: Once | INTRAVENOUS | Status: DC | PRN

## 2022-03-06 MED ORDER — BENZOCAINE-MENTHOL 20-0.5 % EX AERO
1.0000 "application " | INHALATION_SPRAY | CUTANEOUS | Status: DC | PRN
  Administered 2022-03-06: 1 via TOPICAL
  Filled 2022-03-06: qty 56

## 2022-03-06 NOTE — Anesthesia Postprocedure Evaluation (Signed)
Anesthesia Post Note ? ?Patient: Mary Hutchinson ? ?Procedure(s) Performed: AN AD HOC LABOR EPIDURAL ? ?  ? ?Patient location during evaluation: Mother Baby ?Anesthesia Type: Epidural ?Level of consciousness: awake and alert ?Pain management: pain level controlled ?Vital Signs Assessment: post-procedure vital signs reviewed and stable ?Respiratory status: spontaneous breathing, nonlabored ventilation and respiratory function stable ?Cardiovascular status: stable ?Postop Assessment: no headache, no backache, epidural receding, no apparent nausea or vomiting, patient able to bend at knees, adequate PO intake and able to ambulate ?Anesthetic complications: no ? ? ?No notable events documented. ? ?Last Vitals:  ?Vitals:  ? 03/06/22 0340 03/06/22 0440  ?BP: 116/74 117/75  ?Pulse: 95 (!) 105  ?Resp: 16 18  ?Temp: 36.8 ?C 36.9 ?C  ?SpO2: 99% 98%  ?  ?Last Pain:  ?Vitals:  ? 03/06/22 0725  ?TempSrc:   ?PainSc: 0-No pain  ? ?Pain Goal: Patients Stated Pain Goal: 2 (03/06/22 4401) ? ?  ?  ?  ?  ?  ?  ?  ? ?[name] ? ? ? ? ?
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
## 2022-03-07 LAB — CULTURE, BETA STREP (GROUP B ONLY)
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
## 2022-03-08 LAB — SURGICAL PATHOLOGY

## 2022-03-08 MED ORDER — IBUPROFEN 600 MG PO TABS: 600.0000 mg | ORAL_TABLET | Freq: Four times a day (QID) | ORAL | 0 refills | Status: DC

## 2022-03-08 NOTE — Progress Notes (Signed)
PPD #2 ?Doing well, baby stable in room with her ?Afeb, VSS ?D/c home ?

## 2022-03-08 NOTE — Discharge Summary (Signed)
? ?  Postpartum Discharge Summary ? ? ?   ?Patient Name: Mary Hutchinson ?DOB: 04-Feb-1996 ?MRN: [id number] ? ?Date of admission: 03/05/2022 ?Delivery date:03/06/2022  ?Delivering provider: Derl Barrow E  ?Date of discharge: 03/08/2022 ? ?Admitting diagnosis: Preterm labor in third trimester [O60.03] ?Preterm contractions [O47.00] ?Intrauterine pregnancy: [redacted]w[redacted]d     ?Secondary diagnosis:  Principal Problem: ?  Preterm labor in third trimester ?Active Problems: ?  Preterm contractions ?   ?Discharge diagnosis: Preterm Pregnancy Delivered and Anemia                                              ? ?Hospital course: Onset of Labor With Vaginal Delivery      ?26 y.o. yo G1P0101 at [redacted]w[redacted]d was admitted in Latent Labor on 03/05/2022. Patient had an uncomplicated labor course as follows:  ?Membrane Rupture Time/Date: 11:21 PM ,03/05/2022   ?Delivery Method:Vaginal, Spontaneous  ?Episiotomy: None  ?Lacerations:  1st degree  ?Patient had an uncomplicated postpartum course.  She received a dose of IV iron for anemia.  She is ambulating, tolerating a regular diet, passing flatus, and urinating well. Patient is discharged home in stable condition on 03/08/22. ? ?Newborn Data: ?Birth date:03/06/2022  ?Birth time:1:51 AM  ?Gender:Female  ?Living status:Living  ?Apgars:9 ,9  ?Weight:2280 g  ? ? ?Physical exam  ?Vitals:  ? 03/07/22 0542 03/07/22 1714 03/07/22 2100 03/08/22 0530  ?BP: 106/65 124/71 113/73 106/67  ?Pulse: 74 91 97 88  ?Resp: 16 17 18 16   ?Temp: 98.1 ?F (36.7 ?C) 98 ?F (36.7 ?C) 98.3 ?F (36.8 ?C) 98.3 ?F (36.8 ?C)  ?TempSrc: Oral Oral Oral Oral  ?SpO2: 98%  97% 98%  ?Weight:      ?Height:      ? ?General: alert ?Lochia: appropriate ?Uterine Fundus: firm ? ?Labs: ?Lab Results  ?Component Value Date  ? WBC 18.0 (H) 03/06/2022  ? HGB 8.1 (L) 03/06/2022  ? HCT 25.4 (L) 03/06/2022  ? MCV 84.4 03/06/2022  ? PLT 207 03/06/2022  ? ? ?  Latest Ref Rng & Units 01/07/2022  ?  9:21 AM  ?CMP  ?Glucose 70 - 99 mg/dL 85    ?BUN 6 - 20 mg/dL 11     ?Creatinine 0.44 - 1.00 mg/dL 03/09/2022    ?Sodium 135 - 145 mmol/L 133    ?Potassium 3.5 - 5.1 mmol/L 3.5    ?Chloride 98 - 111 mmol/L 105    ?CO2 22 - 32 mmol/L 21    ?Calcium 8.9 - 10.3 mg/dL 8.0    ?Total Protein 6.5 - 8.1 g/dL 6.3    ?Total Bilirubin 0.3 - 1.2 mg/dL 0.3    ?Alkaline Phos 38 - 126 U/L 64    ?AST 15 - 41 U/L 26    ?ALT 0 - 44 U/L 30    ? ?Edinburgh Score: ? ?  03/06/2022  ?  7:43 PM  ?03/08/2022 Postnatal Depression Scale Screening Tool  ?I have been able to laugh and see the funny side of things. 0  ?I have looked forward with enjoyment to things. 0  ?I have blamed myself unnecessarily when things went wrong. 0  ?I have been anxious or worried for no good reason. 1  ?I have felt scared or panicky for no good reason. 1  ?Things have been getting on top of me. 0  ?I have been so unhappy  that I have had difficulty sleeping. 0  ?I have felt sad or miserable. 1  ?I have been so unhappy that I have been crying. 1  ?The thought of harming myself has occurred to me. 0  ?Edinburgh Postnatal Depression Scale Total 4  ? ? ? ? ?After visit meds:  ?Allergies as of 03/08/2022   ?No Known Allergies ?  ? ?  ?Medication List  ?  ? ?TAKE these medications   ? ?ibuprofen 600 MG tablet ?Commonly known as: ADVIL ?Take 1 tablet (600 mg total) by mouth every 6 (six) hours. ?  ?prenatal multivitamin Tabs tablet ?Take 1 tablet by mouth daily at 12 noon. ?  ? ?  ? ? ? ?Discharge home in stable condition ?Infant Feeding: Breast ?Infant Disposition:home with mother ?Discharge instruction: per After Visit Summary and Postpartum booklet. ?Activity: Advance as tolerated. Pelvic rest for 6 weeks.  ?Diet: routine diet ?Postpartum Appointment:6 weeks ?Future Appointments: ?Future Appointments  ?Date Time Provider Department Center  ?03/14/2022 10:30 AM [facility] None  ? ?Follow up Visit: ? Follow-up Information   ? ? Huel Cote, MD. Schedule an appointment as soon as possible for a visit in 6 week(s).   ?Specialty:  Obstetrics and Gynecology ?Contact information: ?[address] ?[address] ?[place] [address] ?[phone] ? ? ?  ?  ? ?  ?  ? ?  ? ? ? ?  ? ?03/08/2022 ?Zenaida Niece, MD ? ? ?

## 2022-03-08 NOTE — Discharge Instructions (Signed)
As per discharge pamphlet °

## 2022-03-14 ENCOUNTER — Telehealth (HOSPITAL_COMMUNITY): Payer: Self-pay | Admitting: *Deleted

## 2022-03-14 ENCOUNTER — Encounter (HOSPITAL_COMMUNITY): Payer: 59

## 2022-03-14 NOTE — Telephone Encounter (Signed)
Patient voiced no questions or concerns regarding her own health at this time. EPDS=1. Patient voiced no questions or concerns regarding infant at this time. Patient reports infant sleeps in a crib on her back. RN reviewed ABCs of safe sleep. Patient verbalized understanding. Patient informed about hospital's virtual postpartum classes and support groups. Declined email information at this time. Deforest Hoyles, RN, 03/14/22, [phone]  ?

## 2022-08-01 ENCOUNTER — Telehealth: Payer: 59 | Admitting: Physician Assistant

## 2022-08-01 DIAGNOSIS — R051 Acute cough: Secondary | ICD-10-CM | POA: Diagnosis not present

## 2022-08-01 DIAGNOSIS — K219 Gastro-esophageal reflux disease without esophagitis: Secondary | ICD-10-CM

## 2022-08-01 MED ORDER — BENZONATATE 100 MG PO CAPS: 100.0000 mg | ORAL_CAPSULE | Freq: Three times a day (TID) | ORAL | 0 refills | Status: DC | PRN

## 2022-08-01 MED ORDER — OMEPRAZOLE 20 MG PO CPDR: 20.0000 mg | DELAYED_RELEASE_CAPSULE | Freq: Every day | ORAL | 0 refills | Status: AC

## 2022-08-01 NOTE — Progress Notes (Signed)
We are sorry that you are not feeling well.  Here is how we plan to help!  Based on your presentation I believe you most likely have  A cough due to reflux. To lessen this cough you can use the over-the counter cough medication Delsym but it is very important that we control the cause of the cough.  I suggest that you begin Prilosec 20 mg twice a day for 2 weeks. I will prescribe this medication for you. You should see the cough lessen quickly as your reflux ( which may be silent or without burning ) is treated.   In addition you may use A prescription cough medication called Tessalon Perles 100mg . You may take 1-2 capsules every 8 hours as needed for your cough.  From your responses in the eVisit questionnaire you describe inflammation in the upper respiratory tract which is causing a significant cough.  This is commonly called Bronchitis and has four common causes:   Allergies Viral Infections Acid Reflux Bacterial Infection Allergies, viruses and acid reflux are treated by controlling symptoms or eliminating the cause. An example might be a cough caused by taking certain blood pressure medications. You stop the cough by changing the medication. Another example might be a cough caused by acid reflux. Controlling the reflux helps control the cough.  USE OF BRONCHODILATOR ("RESCUE") INHALERS: There is a risk from using your bronchodilator too frequently.  The risk is that over-reliance on a medication which only relaxes the muscles surrounding the breathing tubes can reduce the effectiveness of medications prescribed to reduce swelling and congestion of the tubes themselves.  Although you feel brief relief from the bronchodilator inhaler, your asthma may actually be worsening with the tubes becoming more swollen and filled with mucus.  This can delay other crucial treatments, such as oral steroid medications. If you need to use a bronchodilator inhaler daily, several times per day, you should discuss  this with your provider.  There are probably better treatments that could be used to keep your asthma under control.     HOME CARE Only take medications as instructed by your medical team. Complete the entire course of an antibiotic. Drink plenty of fluids and get plenty of rest. Avoid close contacts especially the very young and the elderly Cover your mouth if you cough or cough into your sleeve. Always remember to wash your hands A steam or ultrasonic humidifier can help congestion.   GET HELP RIGHT AWAY IF: You develop worsening fever. You become short of breath You cough up blood. Your symptoms persist after you have completed your treatment plan MAKE SURE YOU  Understand these instructions. Will watch your condition. Will get help right away if you are not doing well or get worse.    Thank you for choosing an e-visit.  Your e-visit answers were reviewed by a board certified advanced clinical practitioner to complete your personal care plan. Depending upon the condition, your plan could have included both over the counter or prescription medications.  Please review your pharmacy choice. Make sure the pharmacy is open so you can pick up prescription now. If there is a problem, you may contact your provider through [employer] and have the prescription routed to another pharmacy.  Your safety is important to [place]. If you have drug allergies check your prescription carefully.   For the next 24 hours you can use MyChart to ask questions about today's visit, request a non-urgent call back, or ask for a work or school  excuse. You will get an email in the next two days asking about your experience. I hope that your e-visit has been valuable and will speed your recovery.  I provided 5 minutes of non face-to-face time during this encounter for chart review and documentation.

## 2022-09-07 DIAGNOSIS — Z419 Encounter for procedure for purposes other than remedying health state, unspecified: Secondary | ICD-10-CM | POA: Diagnosis not present

## 2022-09-21 IMAGING — CR DG FOOT COMPLETE 3+V*L*
3 series · 3 of 3 positions shown · non-contrast
Comparison: No prior.

CLINICAL DATA: Left foot pain after fall.

EXAM:
LEFT FOOT - COMPLETE 3+ VIEW

[x foot ap left]
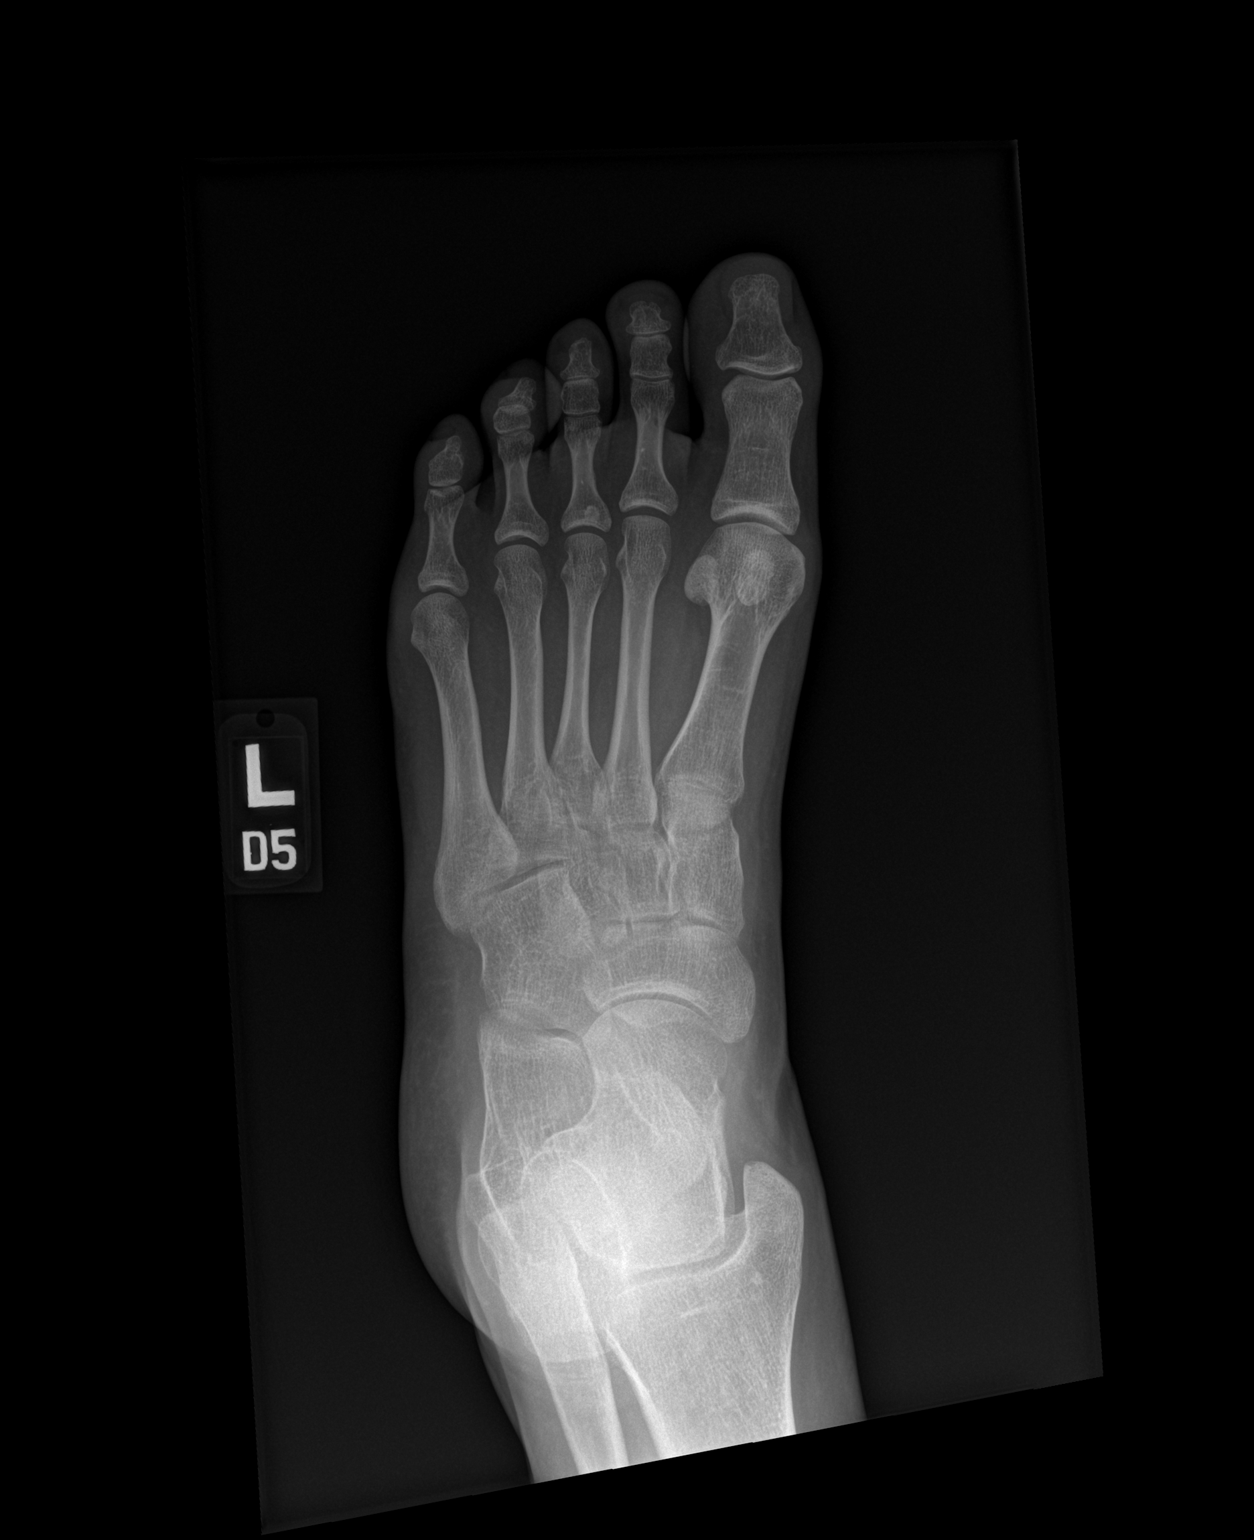

[x foot obl left]
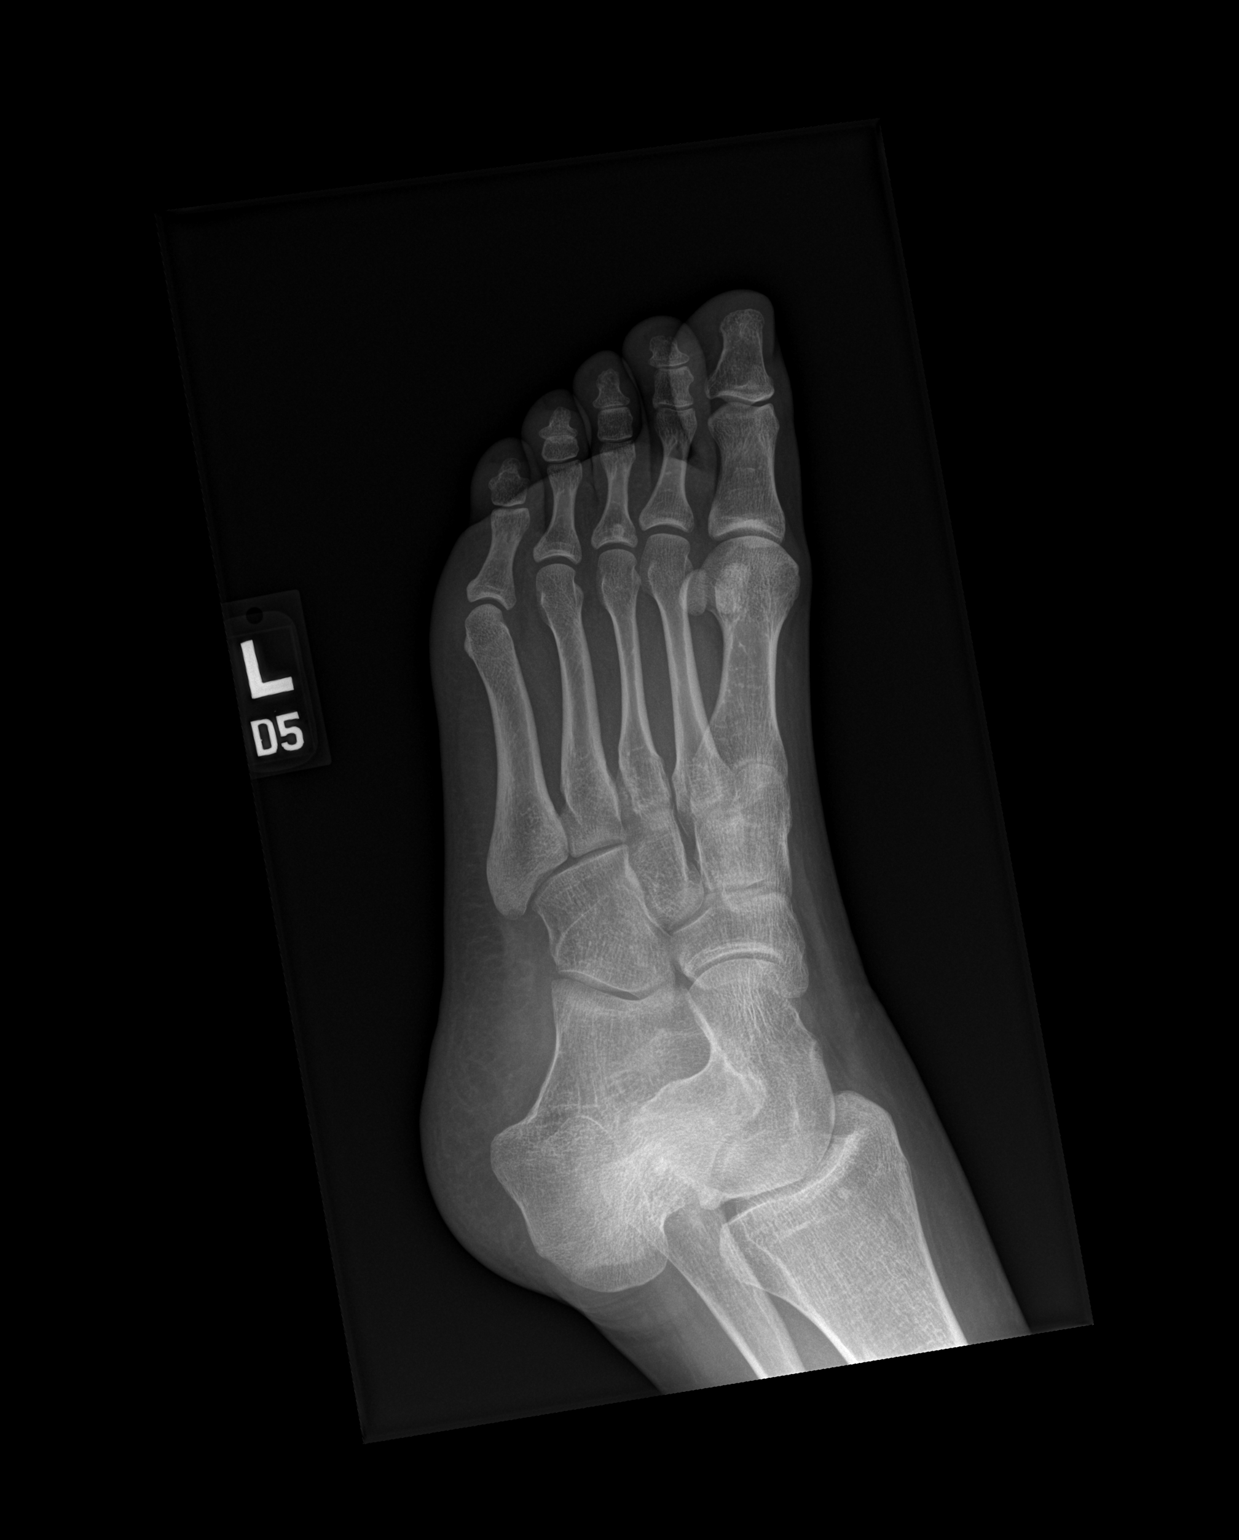

[x foot lat left]
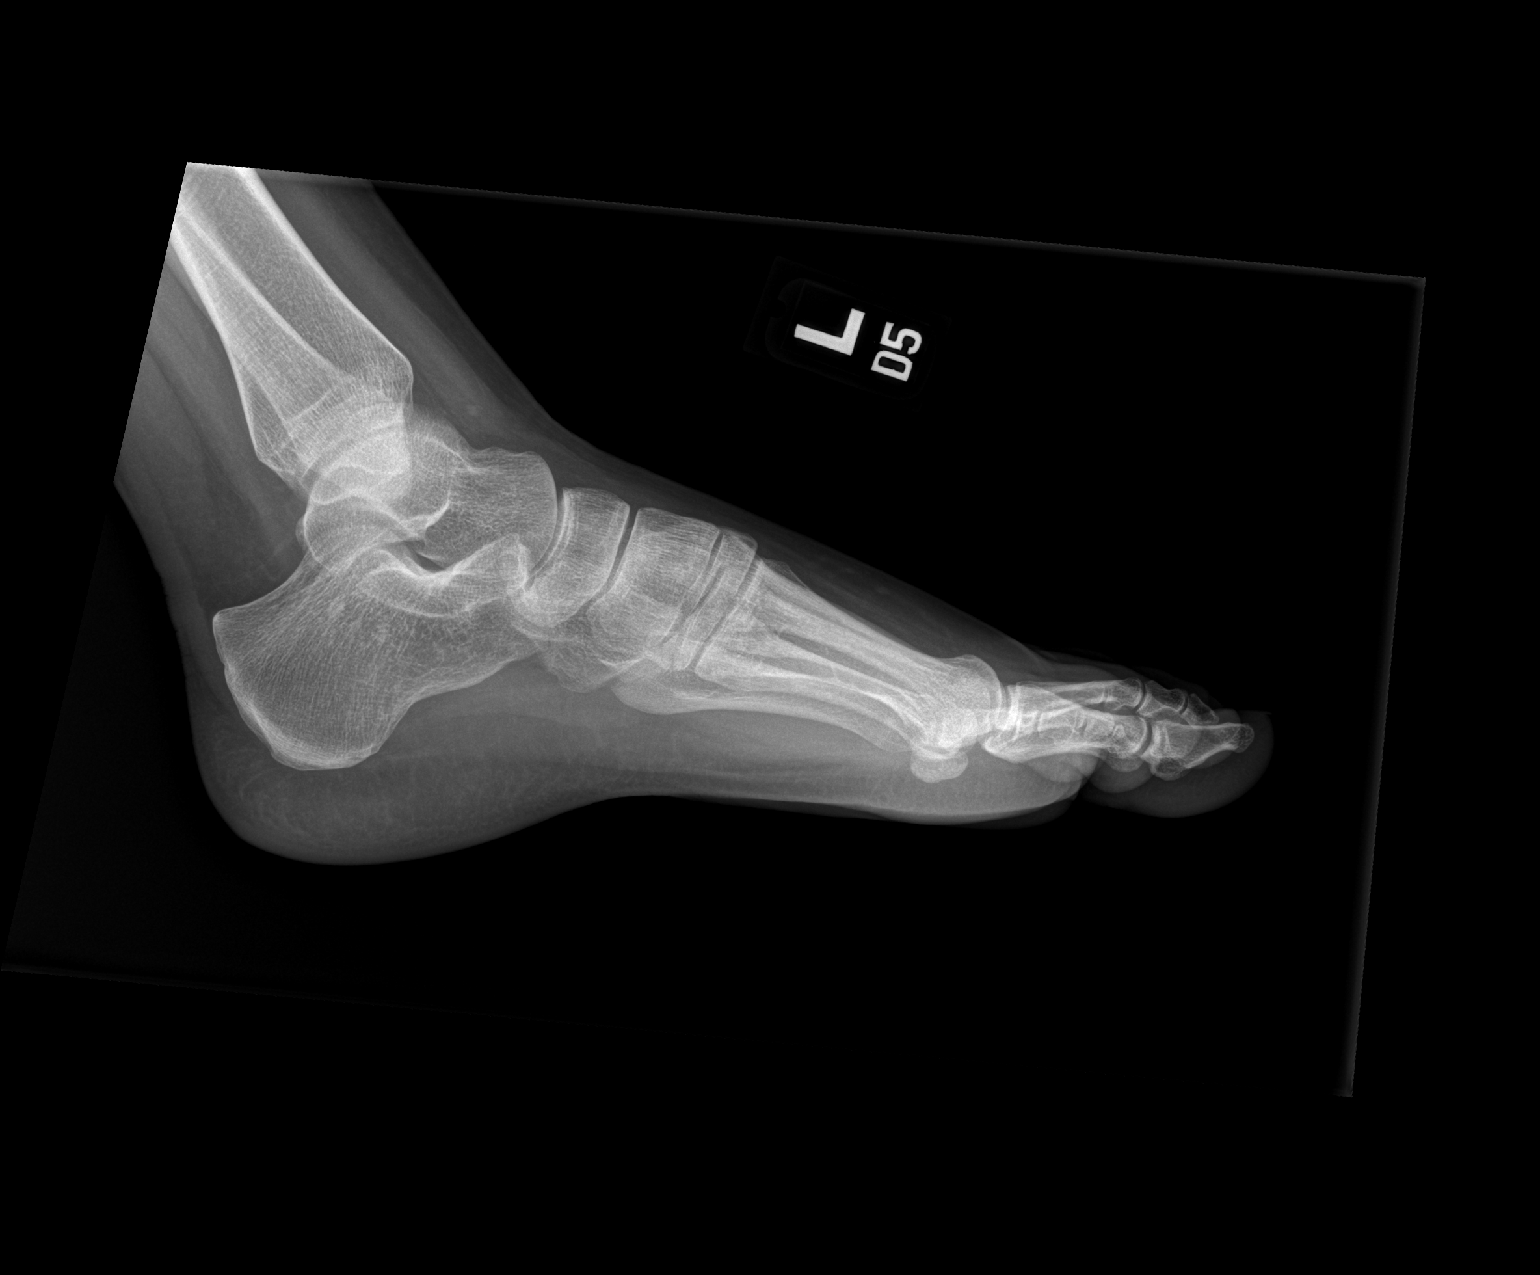

[3 of 3 positions shown; findings below may reference images not displayed]

FINDINGS: No acute bony or joint abnormality. No evidence of fracture
dislocation.
IMPRESSION: Acute abnormality.

## 2022-10-07 DIAGNOSIS — Z419 Encounter for procedure for purposes other than remedying health state, unspecified: Secondary | ICD-10-CM | POA: Diagnosis not present

## 2022-11-07 DIAGNOSIS — Z419 Encounter for procedure for purposes other than remedying health state, unspecified: Secondary | ICD-10-CM | POA: Diagnosis not present

## 2022-12-08 DIAGNOSIS — Z419 Encounter for procedure for purposes other than remedying health state, unspecified: Secondary | ICD-10-CM | POA: Diagnosis not present

## 2023-01-06 DIAGNOSIS — Z419 Encounter for procedure for purposes other than remedying health state, unspecified: Secondary | ICD-10-CM | POA: Diagnosis not present

## 2023-11-13 ENCOUNTER — Ambulatory Visit (HOSPITAL_BASED_OUTPATIENT_CLINIC_OR_DEPARTMENT_OTHER)
Admission: EM | Admit: 2023-11-13 | Discharge: 2023-11-13 | Disposition: A | Discharge status: Home / Self Care | Payer: 59 | Attending: Physician Assistant | Admitting: Physician Assistant

## 2023-11-13 ENCOUNTER — Encounter (HOSPITAL_BASED_OUTPATIENT_CLINIC_OR_DEPARTMENT_OTHER): Payer: Self-pay | Admitting: Emergency Medicine

## 2023-11-13 DIAGNOSIS — R350 Frequency of micturition: Secondary | ICD-10-CM | POA: Diagnosis not present

## 2023-11-13 DIAGNOSIS — N390 Urinary tract infection, site not specified: Secondary | ICD-10-CM | POA: Diagnosis present

## 2023-11-13 DIAGNOSIS — Z87440 Personal history of urinary (tract) infections: Secondary | ICD-10-CM | POA: Diagnosis present

## 2023-11-13 LAB — POCT URINALYSIS DIP (MANUAL ENTRY)
Bilirubin, UA: NEGATIVE
Glucose, UA: NEGATIVE mg/dL
Ketones, POC UA: NEGATIVE mg/dL
Nitrite, UA: NEGATIVE
Protein Ur, POC: 30 mg/dL — AB
Spec Grav, UA: >=1.03 — AB
Urobilinogen, UA: 0.2 U/dL
pH, UA: 5.5

## 2023-11-13 LAB — POCT URINE PREGNANCY: Preg Test, Ur: NEGATIVE

## 2023-11-13 MED ORDER — NITROFURANTOIN MONOHYD MACRO 100 MG PO CAPS: 100.0000 mg | ORAL_CAPSULE | Freq: Two times a day (BID) | ORAL | 0 refills | Status: AC

## 2023-11-13 NOTE — ED Provider Notes (Signed)
 [facility]    CSN: [id number] Arrival date & time: 11/13/23  1540      History   Chief Complaint No chief complaint on file.   HPI Mary Hutchinson is a 28 y.o. female.   Patient presents today with a 1 week history of UTI symptoms.  She describes this as occasional dysuria but primarily urinary frequency and urgency.  She denies any change to vaginal discharge or associated pelvic pain, abdominal pain, fever, nausea, vomiting.  She does have a history of UTI with similar presentation.  She denies any recent antibiotics.  She denies any recent uterine procedure or self-catheterization.  Denies history of nephrolithiasis.  No specific concern for pregnancy as she is currently on her menstrual cycle.  She denies history of diabetes and does not take SGLT2 inhibitor.    Past Medical History:  Diagnosis Date   Chronic kidney disease     Patient Active Problem List   Diagnosis Date Noted   Preterm labor in third trimester 03/05/2022   Preterm contractions 03/05/2022   Pyelonephritis affecting pregnancy in third trimester 01/07/2022    Past Surgical History:  Procedure Laterality Date   NO PAST SURGERIES      OB History     Gravida  1   Para  1   Term  0   Preterm  1   AB  0   Living  1      SAB  0   IAB  0   Ectopic  0   Multiple  0   Live Births  1            Home Medications    Prior to Admission medications   Medication Sig Start Date End Date Taking? Authorizing Provider  nitrofurantoin , macrocrystal-monohydrate, (MACROBID ) 100 MG capsule Take 1 capsule (100 mg total) by mouth 2 (two) times daily. 11/13/23  Yes Jenny Lai K, PA-C  benzonatate  (TESSALON ) 100 MG capsule Take 1 capsule (100 mg total) by mouth 3 (three) times daily as needed. 08/01/22   Vivienne Delon HERO, PA-C  ibuprofen  (ADVIL ) 600 MG tablet Take 1 tablet (600 mg total) by mouth every 6 (six) hours. 03/08/22   Meisinger, Todd, MD  omeprazole  (PRILOSEC) 20 MG capsule  Take 1 capsule (20 mg total) by mouth daily. 08/01/22   Vivienne Delon HERO, PA-C  Prenatal Vit-Fe Fumarate-FA (PRENATAL MULTIVITAMIN) TABS tablet Take 1 tablet by mouth daily at 12 noon.    [provider]    Family History History reviewed. No pertinent family history.  Social History Social History   Tobacco Use   Smoking status: Never   Smokeless tobacco: Never  Substance Use Topics   Alcohol use: Not Currently    Comment: occasionally   Drug use: Never     Allergies   Patient has no known allergies.   Review of Systems Review of Systems  Constitutional:  Negative for activity change, appetite change, fatigue and fever.  Gastrointestinal:  Negative for abdominal pain, diarrhea, nausea and vomiting.  Genitourinary:  Positive for dysuria, frequency and urgency. Negative for flank pain, hematuria, vaginal bleeding, vaginal discharge and vaginal pain.  Musculoskeletal:  Negative for arthralgias, back pain and myalgias.     Physical Exam Triage Vital Signs ED Triage Vitals  Encounter Vitals Group     BP 11/13/23 1551 122/87     Systolic BP Percentile --      Diastolic BP Percentile --      Pulse Rate 11/13/23 1551  100     Resp 11/13/23 1551 18     Temp 11/13/23 1551 98.5 F (36.9 C)     Temp Source 11/13/23 1551 Oral     SpO2 11/13/23 1551 97 %     Weight --      Height --      Head Circumference --      Peak Flow --      Pain Score 11/13/23 1549 0     Pain Loc --      Pain Education --      Exclude from Growth Chart --    No data found.  Updated Vital Signs BP 122/87 (BP Location: Right Arm)   Pulse 100   Temp 98.5 F (36.9 C) (Oral)   Resp 18   LMP 11/12/2023   SpO2 97%   Visual Acuity Right Eye Distance:   Left Eye Distance:   Bilateral Distance:    Right Eye Near:   Left Eye Near:    Bilateral Near:     Physical Exam Vitals reviewed.  Constitutional:      General: She is awake. She is not in acute distress.    Appearance:  Normal appearance. She is well-developed. She is not ill-appearing.     Comments: Very pleasant female appears stated age in no acute distress sitting comfortably in exam room  HENT:     Head: Normocephalic and atraumatic.  Cardiovascular:     Rate and Rhythm: Normal rate and regular rhythm.     Heart sounds: Normal heart sounds, S1 normal and S2 normal. No murmur heard. Pulmonary:     Effort: Pulmonary effort is normal.     Breath sounds: Normal breath sounds. No wheezing, rhonchi or rales.     Comments: Clear to auscultation bilaterally Abdominal:     General: Bowel sounds are normal.     Palpations: Abdomen is soft.     Tenderness: There is no abdominal tenderness. There is no right CVA tenderness, left CVA tenderness, guarding or rebound.     Comments: Benign abdominal exam  Genitourinary:    Comments: Exam deferred Psychiatric:        Behavior: Behavior is cooperative.      UC Treatments / Results  Labs (all labs ordered are listed, but only abnormal results are displayed) Labs Reviewed  POCT URINALYSIS DIP (MANUAL ENTRY) - Abnormal; Notable for the following components:      Result Value   Spec Grav, UA >=1.030 (*)    Blood, UA trace-intact (*)    Protein Ur, POC =30 (*)    Leukocytes, UA Trace (*)    All other components within normal limits  POCT URINE PREGNANCY - Normal  URINE CULTURE    EKG   Radiology No results found.  Procedures Procedures (including critical care time)  Medications Ordered in UC Medications - No data to display  Initial Impression / Assessment and Plan / UC Course  I have reviewed the triage vital signs and the nursing notes.  Pertinent labs & imaging results that were available during my care of the patient were reviewed by me and considered in my medical decision making (see chart for details).     Patient is well-appearing, afebrile, nontoxic, nontachycardic.  Urine pregnancy was negative.  UA concerning for UTI.  Will start  Macrobid  twice daily for 5 days.  Urine culture was sent and we will contact her if we need to discontinue or change her antibiotic based on culture results.  She was encouraged to push fluids.  Discussed that if her symptoms are not improving within a few days or if anything worsens and she has pelvic pain, abdominal pain, fever, nausea, vomiting she needs to be seen immediately.  Strict return precautions given.  Final Clinical Impressions(s) / UC Diagnoses   Final diagnoses:  Acute UTI  Urinary frequency     Discharge Instructions      Start Macrobid  twice daily for 5 days.  This will change the color of your urine.  We will contact you if we need to stop or change your antibiotics based on culture results.  Make sure that you rest and drink plenty of fluid.  If your symptoms are not improving within a few days or if anything worsens and you have abdominal pain, pelvic pain, fever, nausea, vomiting you need to be seen immediately.     ED Prescriptions     Medication Sig Dispense Auth. Provider   nitrofurantoin , macrocrystal-monohydrate, (MACROBID ) 100 MG capsule Take 1 capsule (100 mg total) by mouth 2 (two) times daily. 10 capsule Marrissa Dai K, PA-C      PDMP not reviewed this encounter.   Sherrell Rocky POUR, PA-C 11/13/23 1608

## 2023-11-13 NOTE — Discharge Instructions (Signed)
 Start Macrobid  twice daily for 5 days.  This will change the color of your urine.  We will contact you if we need to stop or change your antibiotics based on culture results.  Make sure that you rest and drink plenty of fluid.  If your symptoms are not improving within a few days or if anything worsens and you have abdominal pain, pelvic pain, fever, nausea, vomiting you need to be seen immediately.

## 2023-11-13 NOTE — ED Triage Notes (Signed)
 Pt c/o urinary frequency x 1 week

## 2023-11-15 LAB — URINE CULTURE: Culture: 40000 — AB

## 2023-11-16 ENCOUNTER — Ambulatory Visit (HOSPITAL_BASED_OUTPATIENT_CLINIC_OR_DEPARTMENT_OTHER)
Admission: EM | Admit: 2023-11-16 | Discharge: 2023-11-16 | Disposition: A | Discharge status: Home / Self Care | Payer: 59 | Attending: Family Medicine | Admitting: Family Medicine

## 2023-11-16 ENCOUNTER — Encounter (HOSPITAL_BASED_OUTPATIENT_CLINIC_OR_DEPARTMENT_OTHER): Payer: Self-pay | Admitting: Emergency Medicine

## 2023-11-16 DIAGNOSIS — K529 Noninfective gastroenteritis and colitis, unspecified: Secondary | ICD-10-CM

## 2023-11-16 MED ORDER — ONDANSETRON 4 MG PO TBDP: 4.0000 mg | ORAL_TABLET | Freq: Three times a day (TID) | ORAL | 0 refills | Status: AC | PRN

## 2023-11-16 NOTE — ED Triage Notes (Signed)
 Pt c/o  stomach pain/cramps, nausea, vomiting, body aches, lightheaded, ? Fever started last night.

## 2023-11-16 NOTE — ED Provider Notes (Signed)
 [facility]    CSN: [id number] Arrival date & time: 11/16/23  9180      History   Chief Complaint No chief complaint on file.   HPI Mary Hutchinson is a 28 y.o. female.   HPI Here for nausea and vomiting and diarrhea.  Symptoms began early this morning.  She is thrown up once but remains very nauseated.  She has had a couple of episodes of loose stools.  No blood in anything.  She has had chills and subjective fever.  She has had a little bit of cramping in her stomach also.  She is on nitrofurantoin  for UTI, confirmed by culture.  She did throw up her nitrofurantoin  this morning. Her UTI symptoms were improving before this vomiting started.  NKDA   last menstrual cycle January 5  Past Medical History:  Diagnosis Date   Chronic kidney disease     Patient Active Problem List   Diagnosis Date Noted   Preterm labor in third trimester 03/05/2022   Preterm contractions 03/05/2022   Pyelonephritis affecting pregnancy in third trimester 01/07/2022    Past Surgical History:  Procedure Laterality Date   NO PAST SURGERIES      OB History     Gravida  1   Para  1   Term  0   Preterm  1   AB  0   Living  1      SAB  0   IAB  0   Ectopic  0   Multiple  0   Live Births  1            Home Medications    Prior to Admission medications   Medication Sig Start Date End Date Taking? Authorizing Provider  ondansetron  (ZOFRAN -ODT) 4 MG disintegrating tablet Take 1 tablet (4 mg total) by mouth every 8 (eight) hours as needed for nausea or vomiting. 11/16/23  Yes Judea Fennimore, Sharlet POUR, MD  nitrofurantoin , macrocrystal-monohydrate, (MACROBID ) 100 MG capsule Take 1 capsule (100 mg total) by mouth 2 (two) times daily. 11/13/23   Raspet, Rocky POUR, PA-C  omeprazole  (PRILOSEC) 20 MG capsule Take 1 capsule (20 mg total) by mouth daily. 08/01/22   Vivienne Delon HERO, PA-C  Prenatal Vit-Fe Fumarate-FA (PRENATAL MULTIVITAMIN) TABS tablet Take 1 tablet by mouth daily at  12 noon.    [provider]    Family History History reviewed. No pertinent family history.  Social History Social History   Tobacco Use   Smoking status: Never   Smokeless tobacco: Never  Substance Use Topics   Alcohol use: Not Currently    Comment: occasionally   Drug use: Never     Allergies   Patient has no known allergies.   Review of Systems Review of Systems   Physical Exam Triage Vital Signs ED Triage Vitals  Encounter Vitals Group     BP 11/16/23 0844 108/73     Systolic BP Percentile --      Diastolic BP Percentile --      Pulse Rate 11/16/23 0844 100     Resp 11/16/23 0844 18     Temp 11/16/23 0844 98.5 F (36.9 C)     Temp Source 11/16/23 0844 Oral     SpO2 11/16/23 0844 99 %     Weight --      Height --      Head Circumference --      Peak Flow --      Pain Score 11/16/23 0843 0  Pain Loc --      Pain Education --      Exclude from Growth Chart --    No data found.  Updated Vital Signs BP 108/73 (BP Location: Right Arm)   Pulse 100   Temp 98.5 F (36.9 C) (Oral)   Resp 18   LMP 11/12/2023   SpO2 99%   Visual Acuity Right Eye Distance:   Left Eye Distance:   Bilateral Distance:    Right Eye Near:   Left Eye Near:    Bilateral Near:     Physical Exam Vitals reviewed.  Constitutional:      General: She is not in acute distress.    Appearance: She is not ill-appearing, toxic-appearing or diaphoretic.  HENT:     Mouth/Throat:     Mouth: Mucous membranes are moist.  Eyes:     Extraocular Movements: Extraocular movements intact.     Conjunctiva/sclera: Conjunctivae normal.     Pupils: Pupils are equal, round, and reactive to light.  Cardiovascular:     Rate and Rhythm: Normal rate and regular rhythm.     Heart sounds: No murmur heard. Pulmonary:     Effort: Pulmonary effort is normal.     Breath sounds: Normal breath sounds.  Abdominal:     General: There is no distension.     Palpations: Abdomen is soft.      Tenderness: There is no abdominal tenderness. There is no guarding.  Musculoskeletal:     Cervical back: Neck supple.  Lymphadenopathy:     Cervical: No cervical adenopathy.  Skin:    Capillary Refill: Capillary refill takes less than 2 seconds.     Coloration: Skin is not jaundiced or pale.  Neurological:     General: No focal deficit present.     Mental Status: She is alert and oriented to person, place, and time.  Psychiatric:        Behavior: Behavior normal.      UC Treatments / Results  Labs (all labs ordered are listed, but only abnormal results are displayed) Labs Reviewed - No data to display  EKG   Radiology No results found.  Procedures Procedures (including critical care time)  Medications Ordered in UC Medications - No data to display  Initial Impression / Assessment and Plan / UC Course  I have reviewed the triage vital signs and the nursing notes.  Pertinent labs & imaging results that were available during my care of the patient were reviewed by me and considered in my medical decision making (see chart for details).   Zofran  is sent in to the pharmacy for the nausea.  We discussed clear liquids and bland diet.  She will at least try to restart her nitrofurantoin  this evening.   Final Clinical Impressions(s) / UC Diagnoses   Final diagnoses:  Gastroenteritis     Discharge Instructions      Ondansetron  dissolved in the mouth every 8 hours as needed for nausea or vomiting. Clear liquids(water, gatorade/pedialyte, ginger ale/sprite, chicken broth/soup) and bland things(crackers/toast, rice, potato, bananas) to eat. Avoid acidic foods like lemon/lime/orange/tomato, and avoid greasy/spicy foods.       ED Prescriptions     Medication Sig Dispense Auth. Provider   ondansetron  (ZOFRAN -ODT) 4 MG disintegrating tablet Take 1 tablet (4 mg total) by mouth every 8 (eight) hours as needed for nausea or vomiting. 10 tablet Vonna Viva Gallaher K, MD       PDMP not reviewed this encounter.   Margerie Fraiser K,  MD 11/16/23 9088

## 2023-11-16 NOTE — Discharge Instructions (Signed)
Ondansetron dissolved in the mouth every 8 hours as needed for nausea or vomiting. Clear liquids(water, gatorade/pedialyte, ginger ale/sprite, chicken broth/soup) and bland things(crackers/toast, rice, potato, bananas) to eat. Avoid acidic foods like lemon/lime/orange/tomato, and avoid greasy/spicy foods.

## 2024-02-28 IMAGING — US US RENAL
1 series · 15 of 25 positions shown · non-contrast
Comparison: None.

CLINICAL DATA: Flank pain.  Twenty-seven weeks pregnant.

EXAM:
RENAL / URINARY TRACT ULTRASOUND COMPLETE

[Series 1: us renal mc & wl · 15 of 48 slices shown]
[im 1/48]
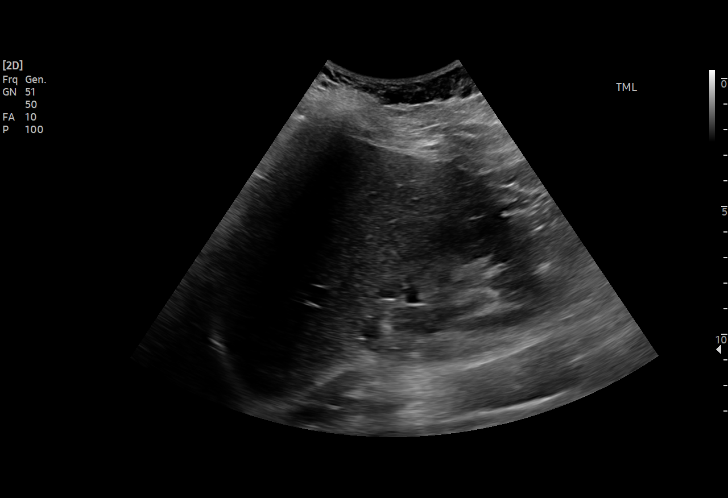
[im 4/48]
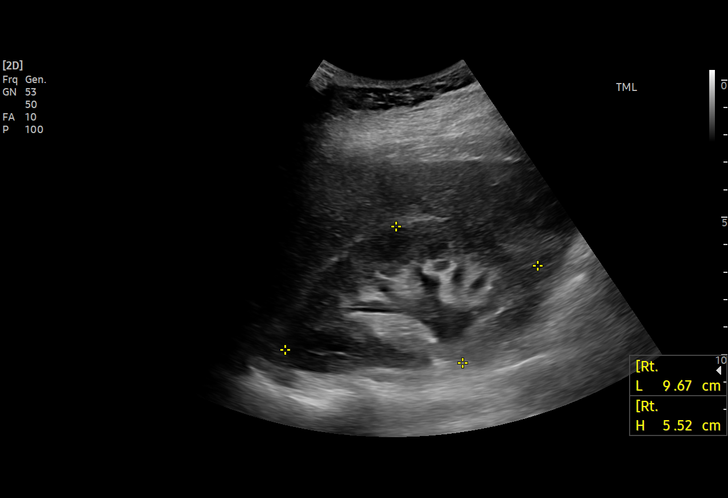
[im 8/48]
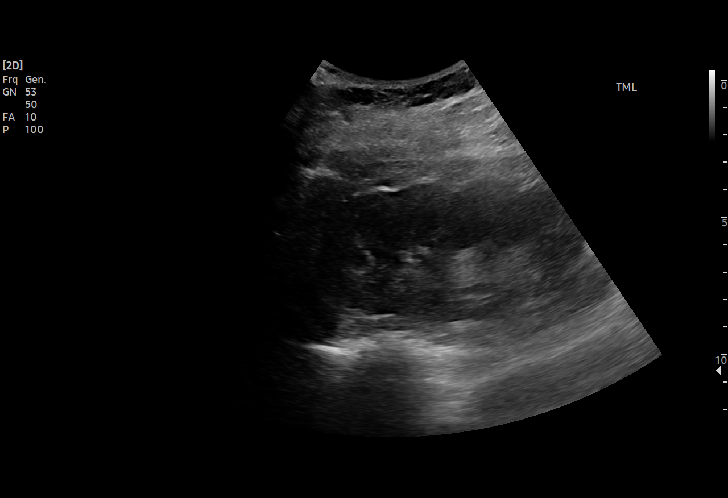
[im 10/48]
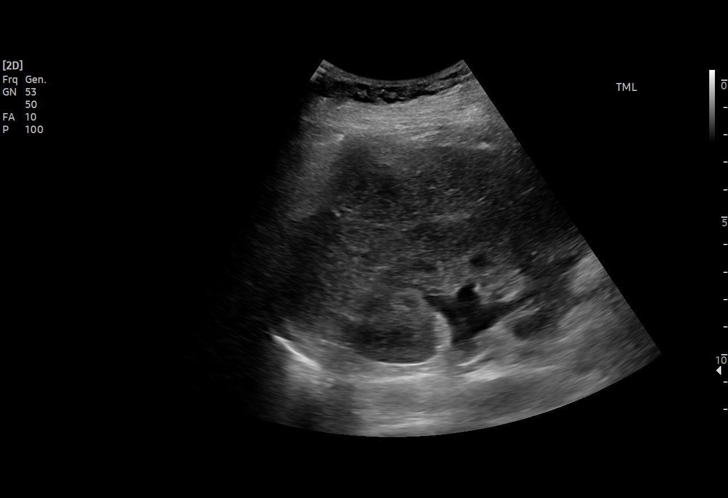
[im 14/48]
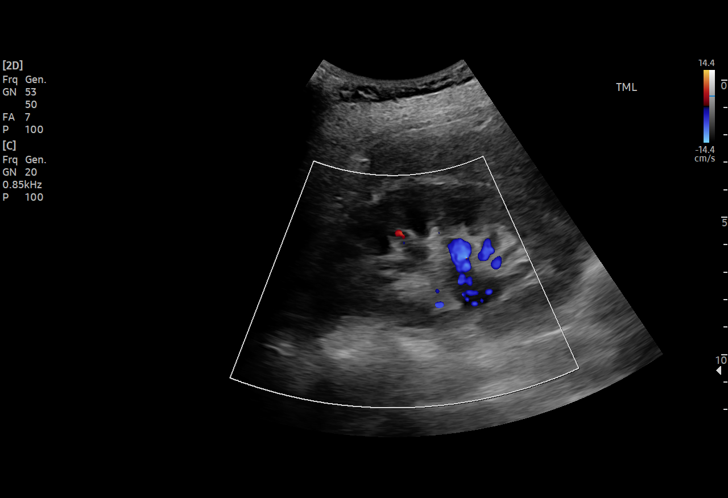
[im 18/48]
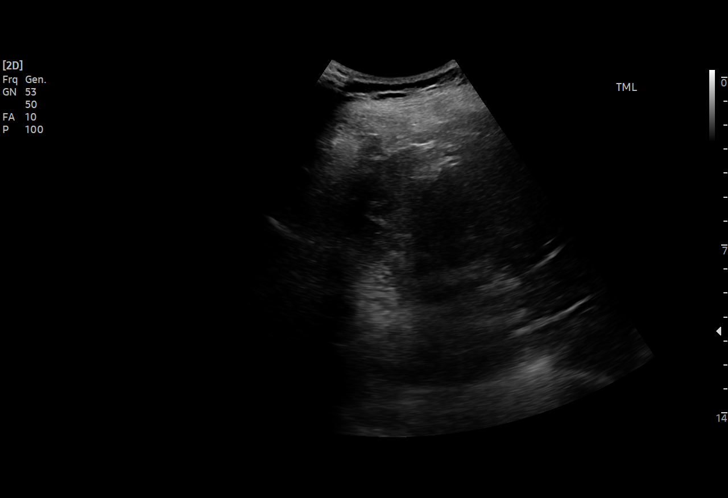
[im 20/48]
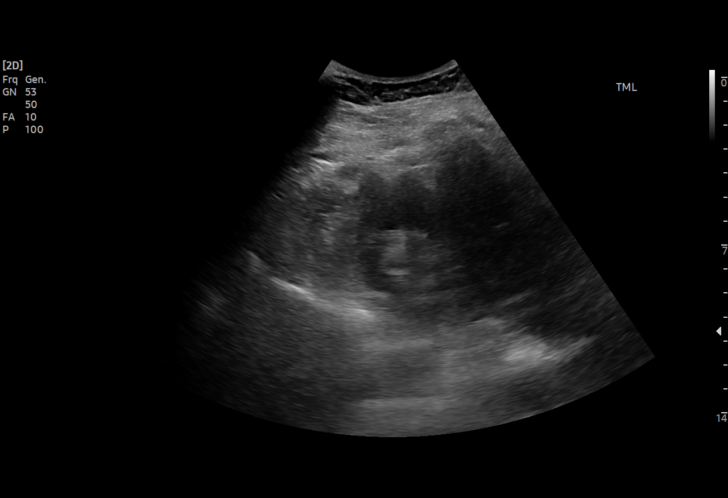
[im 24/48]
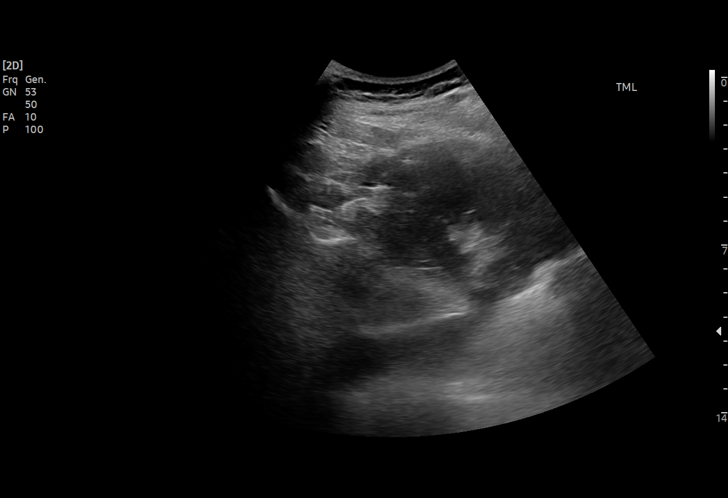
[im 28/48]
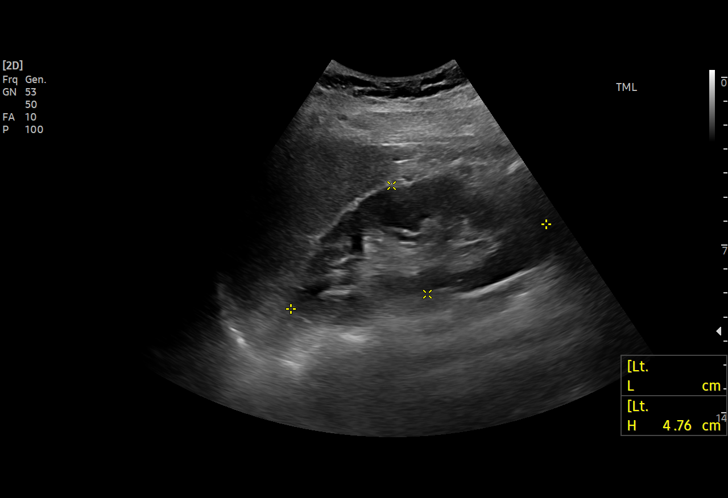
[im 30/48]
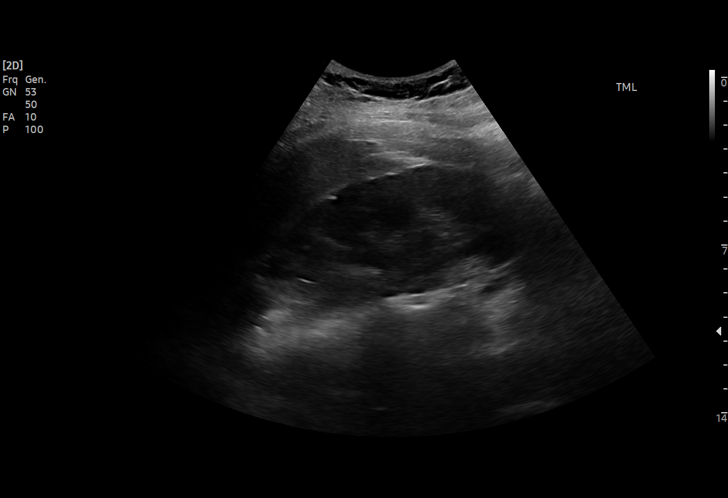
[im 34/48]
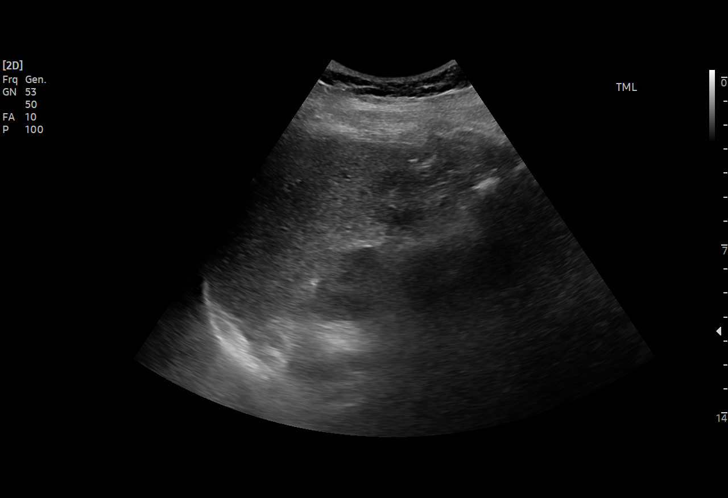
[im 38/48]
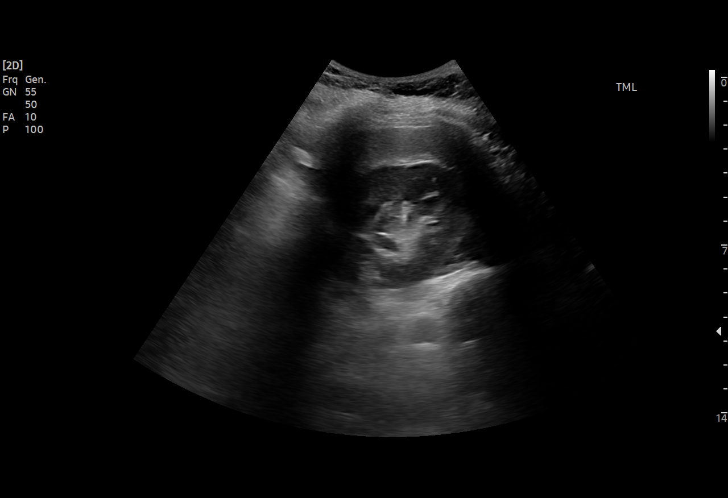
[im 40/48]
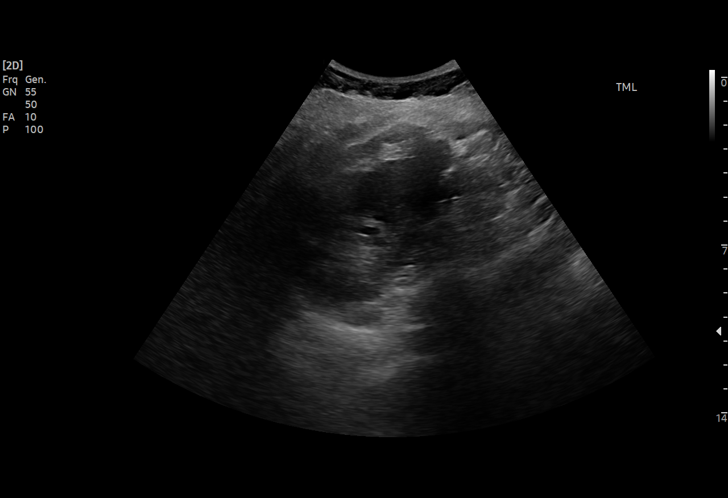
[im 44/48]
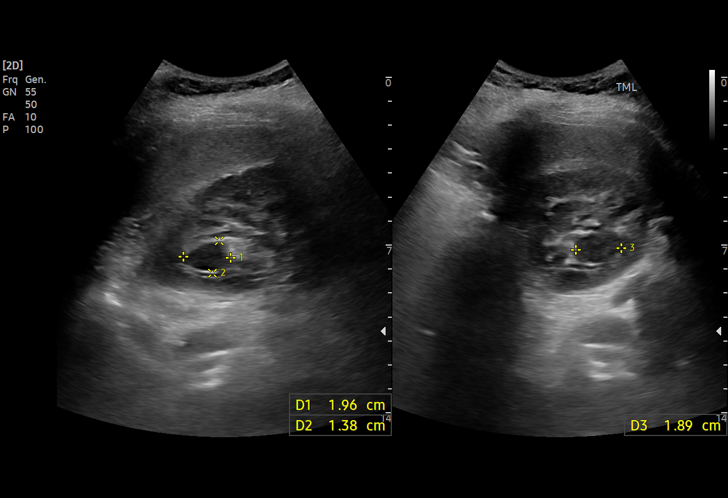
[im 48/48]
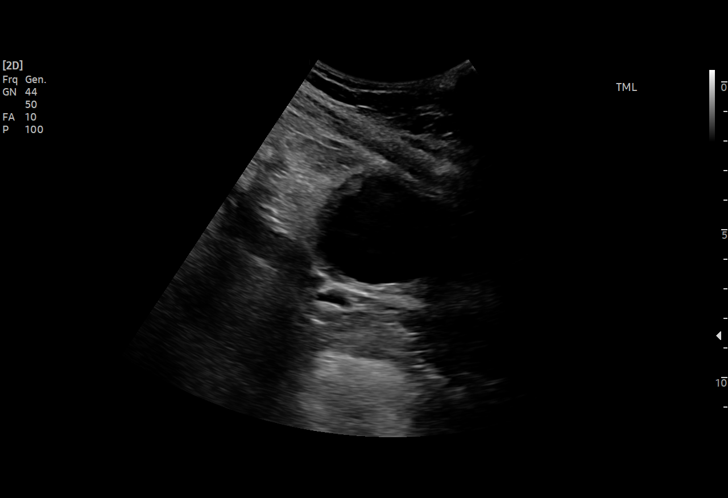

[15 of 25 positions shown; findings below may reference images not displayed]

FINDINGS: Right Kidney:

Renal measurements: 9.7 x 5.8 x 5.5 cm = volume: 161 mL. Normal
echotexture. Mildly dilated collecting system. Prominent internal
vascularity with color Doppler compared to the left kidney. No mass
or calculi visualized.

Left Kidney:

Renal measurements: 11.2 x 5.8 x 4.8 cm = volume: 162 mL. Normal
echotexture. 2.0 cm parapelvic cyst. No hydronephrosis.

Bladder:

Appears normal for degree of bladder distention.

Other:

None.
IMPRESSION: 1. Mild right hydronephrosis. This could be due to ureteral
compression by the gravid uterus or a nonvisualized ureteral
calculus.
2. Prominent internal vascularity in the right kidney compared to
the left kidney. This could be due to the mild right hydronephrosis.
Pyelonephritis can also produce this appearance.
3. Unremarkable left kidney and urinary bladder.

## 2024-08-28 ENCOUNTER — Other Ambulatory Visit: Payer: Self-pay | Admitting: Medical Genetics

## 2024-09-24 ENCOUNTER — Other Ambulatory Visit: Payer: Self-pay

## 2024-09-27 ENCOUNTER — Ambulatory Visit (INDEPENDENT_AMBULATORY_CARE_PROVIDER_SITE_OTHER)
Admit: 2024-09-27 | Discharge: 2024-09-27 | Disposition: A | Discharge status: Home / Self Care | Payer: Self-pay | Admitting: Radiology

## 2024-09-27 ENCOUNTER — Ambulatory Visit (HOSPITAL_BASED_OUTPATIENT_CLINIC_OR_DEPARTMENT_OTHER)
Admission: EM | Admit: 2024-09-27 | Discharge: 2024-09-27 | Disposition: A | Discharge status: Home / Self Care | Payer: Self-pay | Attending: Family Medicine | Admitting: Family Medicine

## 2024-09-27 ENCOUNTER — Encounter (HOSPITAL_BASED_OUTPATIENT_CLINIC_OR_DEPARTMENT_OTHER): Payer: Self-pay

## 2024-09-27 DIAGNOSIS — R0602 Shortness of breath: Secondary | ICD-10-CM

## 2024-09-27 NOTE — ED Triage Notes (Signed)
 Pt c/o shortness of breathe, sore throat, nasal congestion, light headed x 2 days. Denies fever,ear pain. Pt reports while walking and talking she can barely catch her breathe. Pt has not taken any medications for current symptoms.

## 2024-09-27 NOTE — ED Provider Notes (Signed)
 [facility]    CSN: [id number] Arrival date & time: 09/27/24  0820      History   Chief Complaint Chief Complaint  Patient presents with   Shortness of Breath    HPI Mary Hutchinson is a 28 y.o. female.   Pt c/o shortness of breathe, sore throat, nasal congestion, light headed x 2 days. Denies fever,ear pain. Pt reports while walking and talking she can barely catch her breathe. Pt has not taken any medications for current symptoms.    Shortness of Breath   Past Medical History:  Diagnosis Date   Chronic kidney disease     Patient Active Problem List   Diagnosis Date Noted   Preterm labor in third trimester 03/05/2022   Preterm contractions 03/05/2022   Pyelonephritis affecting pregnancy in third trimester 01/07/2022    Past Surgical History:  Procedure Laterality Date   NO PAST SURGERIES      OB History     Gravida  1   Para  1   Term  0   Preterm  1   AB  0   Living  1      SAB  0   IAB  0   Ectopic  0   Multiple  0   Live Births  1            Home Medications    Prior to Admission medications   Medication Sig Start Date End Date Taking? Authorizing Provider  nitrofurantoin , macrocrystal-monohydrate, (MACROBID ) 100 MG capsule Take 1 capsule (100 mg total) by mouth 2 (two) times daily. 11/13/23   Raspet, Rocky POUR, PA-C  omeprazole  (PRILOSEC) 20 MG capsule Take 1 capsule (20 mg total) by mouth daily. 08/01/22   Vivienne Delon HERO, PA-C  ondansetron  (ZOFRAN -ODT) 4 MG disintegrating tablet Take 1 tablet (4 mg total) by mouth every 8 (eight) hours as needed for nausea or vomiting. 11/16/23   Banister, Pamela K, MD  Prenatal Vit-Fe Fumarate-FA (PRENATAL MULTIVITAMIN) TABS tablet Take 1 tablet by mouth daily at 12 noon.    [provider]    Family History History reviewed. No pertinent family history.  Social History Social History   Tobacco Use   Smoking status: Never   Smokeless tobacco: Never  Vaping Use    Vaping status: Every Day   Start date: 11/18/2013  Substance Use Topics   Alcohol use: Not Currently    Comment: occasionally   Drug use: Never     Allergies   Patient has no known allergies.   Review of Systems Review of Systems  Respiratory:  Positive for shortness of breath.      Physical Exam Triage Vital Signs ED Triage Vitals  Encounter Vitals Group     BP 09/27/24 0847 111/79     Girls Systolic BP Percentile --      Girls Diastolic BP Percentile --      Boys Systolic BP Percentile --      Boys Diastolic BP Percentile --      Pulse Rate 09/27/24 0847 88     Resp 09/27/24 0847 18     Temp 09/27/24 0847 98.4 F (36.9 C)     Temp Source 09/27/24 0847 Oral     SpO2 09/27/24 0847 98 %     Weight --      Height --      Head Circumference --      Peak Flow --      Pain Score 09/27/24 0843  0     Pain Loc --      Pain Education --      Exclude from Growth Chart --    No data found.  Updated Vital Signs BP 111/79 (BP Location: Right Arm)   Pulse 88   Temp 98.4 F (36.9 C) (Oral)   Resp 18   LMP 09/09/2024 (Exact Date)   SpO2 98%   Visual Acuity Right Eye Distance:   Left Eye Distance:   Bilateral Distance:    Right Eye Near:   Left Eye Near:    Bilateral Near:     Physical Exam Constitutional:      General: She is not in acute distress.    Appearance: Normal appearance. She is not ill-appearing, toxic-appearing or diaphoretic.  HENT:     Head: Normocephalic and atraumatic.     Right Ear: Tympanic membrane and ear canal normal.     Left Ear: Tympanic membrane and ear canal normal.     Nose: Congestion present.     Mouth/Throat:     Pharynx: Oropharynx is clear.  Eyes:     Conjunctiva/sclera: Conjunctivae normal.  Cardiovascular:     Rate and Rhythm: Normal rate and regular rhythm.     Pulses: Normal pulses.     Heart sounds: Normal heart sounds.  Pulmonary:     Effort: Pulmonary effort is normal.     Breath sounds: Normal breath sounds.   Skin:    General: Skin is warm and dry.  Neurological:     Mental Status: She is alert.  Psychiatric:        Mood and Affect: Mood normal.      UC Treatments / Results  Labs (all labs ordered are listed, but only abnormal results are displayed) Labs Reviewed - No data to display  EKG   Radiology DG Chest 2 View Result Date: 09/27/2024 EXAM: 2 VIEW(S) XRAY OF THE CHEST 09/27/2024 09:09:41 AM COMPARISON: 02/04/23 CLINICAL HISTORY: SOB FINDINGS: LUNGS AND PLEURA: No focal pulmonary opacity. No pleural effusion. No pneumothorax. HEART AND MEDIASTINUM: No acute abnormality of the cardiac and mediastinal silhouettes. BONES AND SOFT TISSUES: No acute osseous abnormality. IMPRESSION: 1. No acute cardiopulmonary process. Electronically signed by: Waddell Calk MD 09/27/2024 09:18 AM EST RP Workstation: [id number]    Procedures Procedures (including critical care time)  Medications Ordered in UC Medications - No data to display  Initial Impression / Assessment and Plan / UC Course  I have reviewed the triage vital signs and the nursing notes.  Pertinent labs & imaging results that were available during my care of the patient were reviewed by me and considered in my medical decision making (see chart for details).     SOB-she most likely has some sort of viral illness.  No concerns on x-ray or exam today.  Can take over-the-counter medications for symptoms as needed.  Follow-up as needed Final Clinical Impressions(s) / UC Diagnoses   Final diagnoses:  SOB (shortness of breath)     Discharge Instructions      No concerns on your xray or exam. Probably something viral. You can take OTC meds as needed.  Follow up as needed.      ED Prescriptions   None    PDMP not reviewed this encounter.   Adah Wilbert LABOR, FNP 09/27/24 [phone]

## 2024-09-27 NOTE — Discharge Instructions (Signed)
 No concerns on your xray or exam. Probably something viral. You can take OTC meds as needed.  Follow up as needed.
